# Patient Record
Sex: Female | Born: 1935
Health system: Southern US, Community
[De-identification: ages and names within clinical notes are randomized; demographics above are authoritative.]

## PROBLEM LIST (undated history)

## (undated) DIAGNOSIS — M1990 Unspecified osteoarthritis, unspecified site: Secondary | ICD-10-CM

## (undated) DIAGNOSIS — E785 Hyperlipidemia, unspecified: Secondary | ICD-10-CM

## (undated) DIAGNOSIS — I501 Left ventricular failure: Secondary | ICD-10-CM

## (undated) DIAGNOSIS — I10 Essential (primary) hypertension: Secondary | ICD-10-CM

## (undated) DIAGNOSIS — E079 Disorder of thyroid, unspecified: Secondary | ICD-10-CM

## (undated) DIAGNOSIS — H409 Unspecified glaucoma: Secondary | ICD-10-CM

## (undated) DIAGNOSIS — F32A Depression, unspecified: Secondary | ICD-10-CM

## (undated) DIAGNOSIS — M159 Polyosteoarthritis, unspecified: Secondary | ICD-10-CM

## (undated) DIAGNOSIS — F329 Major depressive disorder, single episode, unspecified: Secondary | ICD-10-CM

## (undated) DIAGNOSIS — N189 Chronic kidney disease, unspecified: Secondary | ICD-10-CM

## (undated) HISTORY — PX: FRACTURE SURGERY: SHX138

## (undated) HISTORY — DX: Hyperlipidemia, unspecified: E78.5

## (undated) HISTORY — DX: Depression, unspecified: F32.A

## (undated) HISTORY — PX: CORNEAL TRANSPLANT: SHX108

## (undated) HISTORY — DX: Major depressive disorder, single episode, unspecified: F32.9

## (undated) HISTORY — DX: Disorder of thyroid, unspecified: E07.9

## (undated) HISTORY — DX: Unspecified glaucoma: H40.9

## (undated) HISTORY — DX: Chronic kidney disease, unspecified: N18.9

## (undated) HISTORY — DX: Essential (primary) hypertension: I10

## (undated) HISTORY — DX: Unspecified osteoarthritis, unspecified site: M19.90

---

## 2004-12-02 ENCOUNTER — Ambulatory Visit: Payer: Self-pay | Admitting: Internal Medicine

## 2005-07-07 ENCOUNTER — Emergency Department: Payer: Self-pay | Admitting: Unknown Physician Specialty

## 2006-01-05 ENCOUNTER — Ambulatory Visit: Payer: Self-pay | Admitting: Internal Medicine

## 2007-01-11 ENCOUNTER — Ambulatory Visit: Payer: Self-pay | Admitting: Internal Medicine

## 2008-01-14 ENCOUNTER — Ambulatory Visit: Payer: Self-pay | Admitting: Internal Medicine

## 2008-03-20 ENCOUNTER — Ambulatory Visit: Payer: Self-pay | Admitting: Orthopaedic Surgery

## 2008-03-27 ENCOUNTER — Ambulatory Visit: Payer: Self-pay

## 2008-09-23 ENCOUNTER — Inpatient Hospital Stay: Payer: Self-pay | Admitting: Internal Medicine

## 2009-01-14 ENCOUNTER — Ambulatory Visit: Payer: Self-pay | Admitting: Internal Medicine

## 2009-09-23 ENCOUNTER — Inpatient Hospital Stay: Payer: Self-pay | Admitting: Internal Medicine

## 2010-03-18 ENCOUNTER — Ambulatory Visit: Payer: Self-pay | Admitting: Internal Medicine

## 2011-07-15 ENCOUNTER — Emergency Department: Payer: Self-pay | Admitting: Emergency Medicine

## 2014-06-12 ENCOUNTER — Emergency Department: Payer: Self-pay | Admitting: Student

## 2014-09-24 ENCOUNTER — Inpatient Hospital Stay: Payer: Self-pay | Admitting: Internal Medicine

## 2014-09-24 LAB — COMPREHENSIVE METABOLIC PANEL
ALT: 22 U/L
ANION GAP: 4 — AB (ref 7–16)
Albumin: 3.3 g/dL — ABNORMAL LOW (ref 3.4–5.0)
Alkaline Phosphatase: 57 U/L
BUN: 31 mg/dL — AB (ref 7–18)
Bilirubin,Total: 0.7 mg/dL (ref 0.2–1.0)
CHLORIDE: 105 mmol/L (ref 98–107)
CO2: 31 mmol/L (ref 21–32)
CREATININE: 1.91 mg/dL — AB (ref 0.60–1.30)
Calcium, Total: 9.8 mg/dL (ref 8.5–10.1)
EGFR (African American): 32 — ABNORMAL LOW
GFR CALC NON AF AMER: 26 — AB
Glucose: 101 mg/dL — ABNORMAL HIGH (ref 65–99)
Osmolality: 286 (ref 275–301)
POTASSIUM: 3.6 mmol/L (ref 3.5–5.1)
SGOT(AST): 29 U/L (ref 15–37)
Sodium: 140 mmol/L (ref 136–145)
TOTAL PROTEIN: 7.3 g/dL (ref 6.4–8.2)

## 2014-09-24 LAB — URINALYSIS, COMPLETE
BACTERIA: NONE SEEN
Bilirubin,UR: NEGATIVE
Glucose,UR: NEGATIVE mg/dL (ref 0–75)
KETONE: NEGATIVE
NITRITE: NEGATIVE
Ph: 5 (ref 4.5–8.0)
SPECIFIC GRAVITY: 1.02 (ref 1.003–1.030)
Squamous Epithelial: 4

## 2014-09-24 LAB — CK TOTAL AND CKMB (NOT AT ARMC)
CK, Total: 54 U/L (ref 26–192)
CK-MB: 2.7 ng/mL (ref 0.5–3.6)

## 2014-09-24 LAB — TROPONIN I
Troponin-I: 0.06 ng/mL — ABNORMAL HIGH
Troponin-I: 0.04 ng/mL

## 2014-09-24 LAB — CBC
HCT: 42.6 % (ref 35.0–47.0)
HGB: 13.8 g/dL (ref 12.0–16.0)
MCH: 30 pg (ref 26.0–34.0)
MCHC: 32.3 g/dL (ref 32.0–36.0)
MCV: 93 fL (ref 80–100)
Platelet: 209 10*3/uL (ref 150–440)
RBC: 4.58 10*6/uL (ref 3.80–5.20)
RDW: 14.4 % (ref 11.5–14.5)
WBC: 15.5 10*3/uL — AB (ref 3.6–11.0)

## 2014-09-24 LAB — HEMOGLOBIN A1C: HEMOGLOBIN A1C: 5.6 % (ref 4.2–6.3)

## 2014-09-25 LAB — BASIC METABOLIC PANEL WITH GFR
Anion Gap: 6 — ABNORMAL LOW (ref 7–16)
BUN: 24 mg/dL — ABNORMAL HIGH (ref 7–18)
Calcium, Total: 8.4 mg/dL — ABNORMAL LOW (ref 8.5–10.1)
Chloride: 110 mmol/L — ABNORMAL HIGH (ref 98–107)
Co2: 26 mmol/L (ref 21–32)
Creatinine: 1.46 mg/dL — ABNORMAL HIGH (ref 0.60–1.30)
EGFR (African American): 43 — ABNORMAL LOW
EGFR (Non-African Amer.): 35 — ABNORMAL LOW
Glucose: 99 mg/dL (ref 65–99)
Osmolality: 287 (ref 275–301)
Potassium: 3.6 mmol/L (ref 3.5–5.1)
Sodium: 142 mmol/L (ref 136–145)

## 2014-09-25 LAB — CBC WITH DIFFERENTIAL/PLATELET
Basophil #: 0 x10 3/mm 3 (ref 0.0–0.1)
Basophil %: 0.3 %
Eosinophil #: 0.3 x10 3/mm 3 (ref 0.0–0.7)
Eosinophil %: 3.1 %
HCT: 36.6 % (ref 35.0–47.0)
HGB: 12 g/dL (ref 12.0–16.0)
Lymphocyte %: 13.7 %
Lymphs Abs: 1.4 x10 3/mm 3 (ref 1.0–3.6)
MCH: 30.5 pg (ref 26.0–34.0)
MCHC: 32.6 g/dL (ref 32.0–36.0)
MCV: 93 fL (ref 80–100)
Monocyte #: 1 x10 3/mm — ABNORMAL HIGH (ref 0.2–0.9)
Monocyte %: 9.8 %
Neutrophil #: 7.6 x10 3/mm 3 — ABNORMAL HIGH (ref 1.4–6.5)
Neutrophil %: 73.1 %
Platelet: 181 x10 3/mm 3 (ref 150–440)
RBC: 3.92 X10 6/mm 3 (ref 3.80–5.20)
RDW: 14.5 % (ref 11.5–14.5)
WBC: 10.3 x10 3/mm 3 (ref 3.6–11.0)

## 2014-09-26 LAB — URINE CULTURE

## 2014-09-29 LAB — CULTURE, BLOOD (SINGLE)

## 2014-12-23 ENCOUNTER — Emergency Department: Payer: Self-pay | Admitting: Emergency Medicine

## 2014-12-24 ENCOUNTER — Non-Acute Institutional Stay: Payer: Medicare Other | Admitting: Internal Medicine

## 2014-12-24 ENCOUNTER — Encounter: Payer: Self-pay | Admitting: Internal Medicine

## 2014-12-24 VITALS — BP 132/72 | HR 89 | Temp 98.3°F | Resp 18 | Wt 132.0 lb

## 2014-12-24 DIAGNOSIS — W19XXXA Unspecified fall, initial encounter: Secondary | ICD-10-CM

## 2014-12-24 DIAGNOSIS — S0191XA Laceration without foreign body of unspecified part of head, initial encounter: Secondary | ICD-10-CM

## 2014-12-24 DIAGNOSIS — Y92129 Unspecified place in nursing home as the place of occurrence of the external cause: Principal | ICD-10-CM

## 2014-12-24 NOTE — Progress Notes (Addendum)
Subjective:    Patient ID: Amber Reese, female    DOB: 09/28/1918, 81 y.o.   MRN: [id number]  HPI  Asked to evaluate resident in [address] Had a fall yesterday. Was trying to walk around her room without her walker. Fell back against the door case, hit her head.  Went to ER, CT head normal, 5 staples to posterior head She denies increased confusion, headache, blurred vision or dizziness She has not needed to take anything for the pain, RN has been doing neuro checks This is not the first time she has fallen with head injury.  Review of Systems     Past Medical History  Diagnosis Date  . Hypertension   . Chronic kidney disease   . Glaucoma   . Osteoarthritis   . Hyperlipidemia   . Thyroid disease   . Depression    Outpatient Encounter Prescriptions as of 12/24/2014  Medication Sig  . aspirin EC 81 MG tablet Take 1 tablet (81 mg total) by mouth daily.  Marland Kitchen levothyroxine (SYNTHROID, LEVOTHROID) 50 MCG tablet Take 1 tablet (50 mcg total) by mouth daily.  . nortriptyline (PAMELOR) 50 MG capsule Take 1 capsule (50 mg total) by mouth 2 (two) times daily.  Marland Kitchen tolterodine (DETROL LA) 4 MG 24 hr capsule Take 1 capsule (4 mg total) by mouth daily.      History   Social History  . Marital Status: Widowed    Spouse Name: N/A  . Number of Children: N/A  . Years of Education: N/A   Occupational History  . Not on file.   Social History Main Topics  . Smoking status: Never Smoker   . Smokeless tobacco: Not on file  . Alcohol Use: Not on file  . Drug Use: Not on file  . Sexual Activity: Not on file   Other Topics Concern  . Not on file   Social History Narrative  . No narrative on file     Constitutional: Denies fever, malaise, fatigue, headache or abrupt weight changes.  Respiratory: Denies difficulty breathing, shortness of breath, cough or sputum production.   Cardiovascular: Denies chest pain, chest tightness, palpitations or swelling in the hands or feet.    Musculoskeletal: Pt reports difficulty with gait. Denies decrease in range of motion, muscle pain or joint pain and swelling.  Skin: Pt reports laceration to head. Denies redness, rashes, lesions or ulcercations.  Neurological: Pt reports difficulty with memory (prior to accident) and problems with balance. Denies dizziness, difficulty with memory, difficulty with speech.   No other specific complaints in a complete review of systems (except as listed in HPI above).  Objective:   Physical Exam   BP 132/72 mmHg  Pulse 89  Temp(Src) 98.3 F (36.8 C)  Resp 18  Wt 132 lb (59.875 kg) Wt Readings from Last 3 Encounters:  12/24/14 132 lb (59.875 kg)    General: Appears her stated age, chronically ill appearing in NAD. Skin: Warm, dry and intact. 2.5 inch laceration to posterior head. 5 staples intact. Wound not draining. No s/s of infection. HEENT: Head: normal shape and size; Eyes: sclera white, no icterus, conjunctiva pink, PERRLA and EOMs intact;  Cardiovascular: Normal rate and rhythm. S1,S2 noted.  No murmur, rubs or gallops noted.  Pulmonary/Chest: Normal effort and positive vesicular breath sounds. No respiratory distress. No wheezes, rales or ronchi noted.  Musculoskeletal: As I walked in to her apt, she was stumbling around the bedroom without her walker. Gait unsteady. Neurological: Alert and oriented.  Assessment & Plan:   ER follow up s/p fall with head injury:  OK to wash hair tomorrow with warm water Bacitracin to laceration BID Will continue neuro checks for 24-48 hours Will remove staples in 7-10 days Advised her to use her walker wherever she goes, only if a short distance Will refer to PT/OT for eval  Will follow up as needed

## 2014-12-24 NOTE — Patient Instructions (Signed)
Fall Prevention and Home Safety °Falls cause injuries and can affect all age groups. It is possible to prevent falls.  °HOW TO PREVENT FALLS °· Wear shoes with rubber soles that do not have an opening for your toes. °· Keep the inside and outside of your house well lit. °· Use night lights throughout your home. °· Remove clutter from floors. °· Clean up floor spills. °· Remove throw rugs or fasten them to the floor with carpet tape. °· Do not place electrical cords across pathways. °· Put grab bars by your tub, shower, and toilet. Do not use towel bars as grab bars. °· Put handrails on both sides of the stairway. Fix loose handrails. °· Do not climb on stools or stepladders, if possible. °· Do not wax your floors. °· Repair uneven or unsafe sidewalks, walkways, or stairs. °· Keep items you use a lot within reach. °· Be aware of pets. °· Keep emergency numbers next to the telephone. °· Put smoke detectors in your home and near bedrooms. °Ask your doctor what other things you can do to prevent falls. °Document Released: 07/30/2009 Document Revised: 04/03/2012 Document Reviewed: 01/03/2012 °[employer]® Patient Information ©2015 [employer]. This information is not intended to replace advice given to you by your health care provider. Make sure you discuss any questions you have with your health care provider. ° °

## 2014-12-26 ENCOUNTER — Encounter: Payer: Self-pay | Admitting: Internal Medicine

## 2014-12-26 MED ORDER — ASPIRIN EC 81 MG PO TBEC: 81.0000 mg | DELAYED_RELEASE_TABLET | Freq: Every day | ORAL | Status: AC

## 2014-12-26 MED ORDER — TOLTERODINE TARTRATE ER 4 MG PO CP24: 4.0000 mg | ORAL_CAPSULE | Freq: Every day | ORAL | Status: DC

## 2014-12-26 MED ORDER — NORTRIPTYLINE HCL 50 MG PO CAPS: 50.0000 mg | ORAL_CAPSULE | Freq: Two times a day (BID) | ORAL | Status: DC

## 2014-12-26 MED ORDER — LEVOTHYROXINE SODIUM 50 MCG PO TABS: 50.0000 ug | ORAL_TABLET | Freq: Every day | ORAL | Status: AC

## 2014-12-26 NOTE — Addendum Note (Signed)
Addended by: Jearld Fenton on: 12/26/2014 11:52 AM   Modules accepted: Orders

## 2015-02-07 NOTE — Discharge Summary (Signed)
PATIENT NAME:  Amber Reese, Amber Reese MR#:  [id number] DATE OF BIRTH:  [birth year]  DATE OF ADMISSION:  09/24/2014 DATE OF DISCHARGE:  09/26/2014  DISCHARGE DIAGNOSES: 1.  Acute on chronic renal failure. 2.  Urinary tract infection.  3.  Weakness from all of the above.   DISCHARGE MEDICATIONS: Per [facility] med reconciliation. Please see for details. Basically, she will be on her usual home meds with the addition of cefuroxime 500 b.i.d. for a week.   HISTORY AND PHYSICAL: Please see detailed history and physical done on admission.   HOSPITAL COURSE: The patient was admitted with the above. She had leukocytosis. Creatinine increased to 1.91, then decreased the following day to 1.46 with IV fluids and white blood cell count came down to 10,300. Her strength improved and she was able to ambulate 450 feet. Urine cultures were no growth at 8 and 12 hours. They have not been re-reported as of yet. However, as she responded to ceftriaxone, so we will give her cefuroxime at home as noted above as it should have comparable efficacy in the urinary tract infection. Renal cell was negative for obstruction, did show some medical renal disease consistent with her history of CKD III. She will be seen soon in the office. Notably her son is in town from [place] and will be with her for at least a few days.    TIME SPENT: It took approximately 35 minutes to do all discharge tasks today.  ____________________________ Ocie Cornfield. Ouida Sills, MD mwa:sb D: 09/26/2014 10:08:24 ET T: 09/26/2014 13:56:11 ET JOB#: [id number]  cc: Ocie Cornfield. Ouida Sills, MD, <Dictator> Kirk Ruths MD ELECTRONICALLY SIGNED 09/29/2014 13:07

## 2015-02-07 NOTE — Consult Note (Signed)
PATIENT NAME:  Amber Reese, Amber Reese MR#:  [id number] DATE OF BIRTH:  [birth year]  DATE OF CONSULTATION:  09/24/2014  REFERRING PHYSICIAN:  Theodoro Grist, MD CONSULTING PHYSICIAN:  Corey Skains, MD  REASON FOR CONSULTATION: Dizziness, chronic kidney disease, and elevated troponin.   CHIEF COMPLAINT: "I am weak."  HISTORY OF PRESENT ILLNESS: The patient is a 81 year old female with known chronic kidney disease stage IV, who has had appropriate treatment for this and is doing reasonably well. She has had weakness, fatigue and shortness of breath of unknown etiology, and was seen in the Emergency Room with concerns for infection. The patient has been placed on antibiotics for possible unknown infection and is currently feeling better after eating at dinner. The patient does have an elevated troponin of 0.06, more consistent with renal disease and no evidence of current myocardial infarction. She does have an EKG showing normal sinus rhythm with septal myocardial infarction, but no evidence of tachycardia. She has not had any evidence of congestive heart failure or true angina. The remainder of the review of systems was negative for vision change, ringing in the ears, hearing loss, cough, congestion, heartburn, nausea, vomiting, diarrhea, bloody stools, stomach pain, extremity pain, leg weakness, cramping of the buttocks, known blood clots, headaches, blackouts, nosebleeds, frequent urination, urination at night, muscle weakness, numbness, anxiety, depression, skin lesions or skin rashes.   PAST MEDICAL HISTORY:  1.  Chronic kidney disease.  2.  Mixed hyperlipidemia.  3.  Hypertension.   FAMILY HISTORY: No family members with early onset of cardiovascular disease or hypertension.   SOCIAL HISTORY: Currently denies alcohol or tobacco use.   ALLERGIES: AS LISTED.   MEDICATIONS: As listed.   PHYSICAL EXAMINATION:  VITAL SIGNS: Blood pressure is 122/68 bilaterally. Heart rate 72 upright, reclining, and  regular.  GENERAL: She is a well-appearing female in no acute distress.  HEENT: No icterus, thyromegaly, ulcers, hemorrhage, or xanthelasma.  CARDIOVASCULAR: Regular rate and rhythm. Normal S1, S2 without murmur, gallop, or rub. PMI is inferiorly displaced. Carotid upstroke normal without bruit. Jugular pressure is normal.  LUNGS: Have a few basilar crackles, with normal respirations.  ABDOMEN: Soft, nontender, without hepatosplenomegaly or masses. Abdominal aorta is normal size without bruit.  EXTREMITIES: There are 2+ radial, femoral, and dorsal pedal pulses, with trace lower extremity edema. No cyanosis, clubbing or ulcers.  NEUROLOGIC: She is oriented to time, place, and person, with normal mood and affect.   ASSESSMENT: A 81 year old female with dizziness, chronic kidney disease stage IV, elevated troponin more consistent with renal disease, and no evidence of myocardial infarction, with abnormal EKG, improving and stable at this time, without evidence of myocardial infarction, congestive heart failure or true angina.   RECOMMENDATIONS:  1.  No further cardiac work-up of minimal troponin elevation.  2.  Consider echocardiogram if LV systolic dysfunction and/or other symptoms occur with ambulation.  3.  Continue chronic kidney disease stage IV treatment and use hydration if able.  4.  Antibiotic treatment for possible concerns of infection, which may be exacerbating of symptoms above.    ____________________________ Corey Skains, MD bjk:MT D: 09/24/2014 19:32:06 ET T: 09/24/2014 19:45:54 ET JOB#: [id number]  cc: Corey Skains, MD, <Dictator> Corey Skains MD ELECTRONICALLY SIGNED 09/26/2014 7:55

## 2015-02-11 NOTE — H&P (Signed)
PATIENT NAME:  Amber Reese, Amber Reese MR#:  [id number] DATE OF BIRTH:  [birth year]  DATE OF ADMISSION:  09/24/2014  PRIMARY CARE PHYSICIAN: Dr. Ouida Sills.  HISTORY OF PRESENT ILLNESS: The patient is a 81 year old, Caucasian female, with past medical history significant for history of hyperlipidemia, hypothyroidism, history of renal insufficiency, TIA in the past, who presents to the hospital with complaints of lightheadedness and dizziness. Apparently, the patient has not been doing well over the past few days. She has been weak and getting lightheaded and dizzy, especially whenever she was stands up and walks. On Monday, she fell down, had somewhat poor coordination and fell down on her buttocks. While she was sitting, however,  her head again went back and she fell down again and hit her head. Still over the past 2 days, she is having significantly poor balance. She stayed in a few days ago, and, today her housekeeper saw her and felt that she is just not doing well, and notified emergency personnel, and the patient was brought to the Emergency Room for further evaluation. In the Emergency Room, she was hypotensive and she was noted to have acute on chronic renal failure with creatinine of 1.9. Her troponin was also found to be elevated to 0.06. The patient denies any chest pain, however.  PAST MEDICAL HISTORY:  Significant for history of admission in 2010 for diarrhea, hypotension, acute renal failure which was felt to be due to Diamox, hyperlipidemia, hypothyroidism, renal insufficiency chronic, questionable coronary artery disease, TIAs, as well as elevated blood presssure, not on any medications in the past. Past medical history also significant for history of glaucoma, urinary incontinence, osteoporosis, psoriasis and anxiety.   MEDICATIONS: According to medical records, the patient is on aspirin 81 mg p.o. daily, Detrol LA 4 mg p.o. daily, Durezol 1 drop twice daily, levothyroxine 50 mcg once weekly and 50 mcg  once a day, Muro 128 1 drop twice daily, nortriptyline 50 mg p.o. daily, Pravachol 40 mg p.o. daily, Timolol ophthalmic solution 0.5% twice daily.  PAST SURGICAL HISTORY:  Right eye glaucoma operation, which was unsuccessful, which left the patient legally blind, appendectomy as well as tonsillectomy, bilateral cataract removal.  ALLERGIES: None.   FAMILY HISTORY: The patient's sister had depression. The patient's other sister had breast carcinoma. Her niece also had breast carcinoma.   SOCIAL HISTORY: The patient's second husband died in 49. She has been living in an independent living facility ever since. Now she wants to try to get an assisted living facility. Denies any smoking according to prior. Admits of having drinking alcohol intermittently socially only. She used to be a Pharmacist, hospital.   REVIEW OF SYSTEMS: Dizzy, lightheaded, feeling fatigue and weak for the past 2 or 3 days, admitting of some blurring of vision. Admits of having poor vision in her eyes bilaterally. The right eye is legally blind, left eye has some dystrophy with some problems with her left eye, which she has been discussing with [facility] about a possible operation. Admits of having intermittent arrhythmias and feeling presyncopal, especially whenever she stands up. Admits of intermittent constipation, as well as increased frequency of urination, especially at nighttime. She needs to get up at nighttime 3 times to urinate. She drinks plenty of fluids. Admits of using a cane all the time. Denies any fevers or chills, denies any pains, weight loss or gain.  EYES: Denies any double vision , admits of glaucoma. ENT: Denies nasal discharge, bleeding, sinus congestion.  RESPIRATORY: No cough, wheezing, shortness of breath. CARDIOVASCULAR:  Denies chest pains, orthopnea, edema, palpitations. GASTROINTESTINAL: Denies nausea, vomiting, or diarrhea,  change in bowel habits.  GENITOURINARY: Denies dysuria, hematuria or  incontinence. ENDOCRINOLOGY: Denies swollen glands, thirs or excessive urination. HEMATOLOGIC: Denies anemia, easy bruising or bleeding, or swollen glands.  SKIN: Denies acne, rash, lesions, or change in moles.  MUSCULOSKELETAL: Denies arthritis, cramps, swelling. NEUROLOGIC: Denies numbness, lateralized weakness.  PSYCHIATRIC: Denies anxiety, insomnia or depression.  PHYSICAL EXAMINATION:  VITAL SIGNS: On arrival to the hospital, the patient's temperature was not measured, pulse was 83, respiration was 18, blood pressure 91/76, saturation was 97% on room air.  GENERAL: This is a well-developed, well-nourished, pale Caucasian female in no significant distress, lying on the bed.  HEENT: Her pupils, the left is reactive to light. The right seemed to be fixed. Extraocular movements intact. No pharynx erythema. Mucosa is dry.  NECK: No masses. Supple, nontender, no thyromegaly, no adenopathy. No JVD or carotid bruits bilaterally. Full range of motion.  LUNGS: Clear to auscultation in all fields. A few rales were heard on the left posteriorly lower aspect of the lungs. Diminished breath sounds. No wheezing, dullness to  percussion, or overt respiratory distress.  CARDIOVASCULAR: Rythm was regular, no murmurs, gallops or rubs. No lower extremity edema, calf tenderness or cyanosis was noted.  ABDOMEN: Soft, nontender. Bowel sounds are present. No hepatosplenomegaly or masses were noted.  RECTAL: Deferred.  MUSCLE STRENGTH: Able to move all extremities. No cyanosis, degenerative joint disease. Mild kyphosis was noted. Gait was not tested.  SKIN: Did not reveal any rashes, lesions, erythema, nodularity, or induration. It was warm and dry to palpation.  LYMPHATIC: No adenopathy in the cervical region.  NEUROLOGIC: Cranial nerves grossly intact. Sensory is intact. No icterus or aphasia. The patient is alert, oriented to time, person, place, cooperative. Memory is good.  PSYCHIATRIC: No significant  confusion, agitation, or depression was noted.   LABORATORIES: BMP showed glucose level of 101, BUN and creatinine were 31 and 1.91, otherwise unremarkable. Liver enzymes, albumin level of 3.3, otherwise unremarkable. Troponin is elevated at 0.06, white blood cell count is elevated to 15.5, hemoglobin 13.8, platelet count was 209,000.   Urinalysis: Yellow hazy urine, negative for glucose, bilirubin or ketones. Specific gravity 1.020, pH was 5.0, 1+ blood, 30 mg/dL of protein, negative for nitrites, 3+ leukocyte esterase, 21 red blood cells, 77 white blood cells, no bacteria was seen, mucous was present,  27 hyaline casts and calcium oxalate crystals.   EKG: Normal sinus rhythm at 85 beats per minute, normal axis, low voltage QRS, septal infarct, age indeterminate, and no acute ST-T changes were noted and no EKG to compare with.   RADIOLOGIC STUDIES: CT scan of head without contrast, 09/24/2014, showed a stable head CT, no acute intracranial or calvarial findings.   ASSESSMENT AND PLAN: 1. Dizziness , suspicious for orthostatic hypotesnion,  admit the patient to the medical floor. Continue IV fluids. Get orthostatic vital signs checked every shift.  2. Acute on chronic renal failure. We will continue IV fluids. Get ultrasound of the kidneys. We will get a urine culture to rule out urinary tract infection.  3. Hypotesnion, likley SIRS. We will continue IV fluids. The patient is not on any blood pressure medications, questionable Detrol versus infection related. We will follow. We will continue IV fluids for now. We will hold some of her home medications, which could decrease her blood pressure and cause orthostatic hypotesnion in general. Will get blood cultures as well. 4. Elevated troponin, possibly hypotension  related. We will continue the patient on aspirin as well as heparin subcutaneously. No nitroglycerin or beta blockers due to hypotension. We will get an echocardiogram, as well as cardiology  consultation. 5. Hyperglycemia, get hemoglobin A1c to rule out diabetes mellitus as cause of dehydration.  6. Likely urinary tract infection. Get the urine cultures. We'll initiate Rocephin while awaiting for urine cultures.   TIME SPENT: 50 minutes.    ____________________________ Theodoro Grist, MD rv:JT D: 09/24/2014 14:30:01 ET T: 09/24/2014 15:26:32 ET JOB#: [id number]  cc: Theodoro Grist, MD, <Dictator> Ocie Cornfield. Ouida Sills, MD Theodoro Grist MD ELECTRONICALLY SIGNED 10/28/2014 18:39

## 2015-04-07 ENCOUNTER — Emergency Department: Payer: Medicare Other

## 2015-04-07 ENCOUNTER — Encounter: Payer: Self-pay | Admitting: Emergency Medicine

## 2015-04-07 ENCOUNTER — Emergency Department
Admission: EM | Admit: 2015-04-07 | Discharge: 2015-04-07 | Disposition: A | Discharge status: Home / Self Care | Payer: Medicare Other | Attending: Emergency Medicine | Admitting: Emergency Medicine

## 2015-04-07 DIAGNOSIS — I129 Hypertensive chronic kidney disease with stage 1 through stage 4 chronic kidney disease, or unspecified chronic kidney disease: Secondary | ICD-10-CM | POA: Insufficient documentation

## 2015-04-07 DIAGNOSIS — Z79899 Other long term (current) drug therapy: Secondary | ICD-10-CM | POA: Diagnosis not present

## 2015-04-07 DIAGNOSIS — Y9389 Activity, other specified: Secondary | ICD-10-CM | POA: Diagnosis not present

## 2015-04-07 DIAGNOSIS — Z7982 Long term (current) use of aspirin: Secondary | ICD-10-CM | POA: Diagnosis not present

## 2015-04-07 DIAGNOSIS — W010XXA Fall on same level from slipping, tripping and stumbling without subsequent striking against object, initial encounter: Secondary | ICD-10-CM | POA: Diagnosis not present

## 2015-04-07 DIAGNOSIS — Y92121 Bathroom in nursing home as the place of occurrence of the external cause: Secondary | ICD-10-CM | POA: Diagnosis not present

## 2015-04-07 DIAGNOSIS — S3992XA Unspecified injury of lower back, initial encounter: Secondary | ICD-10-CM | POA: Diagnosis not present

## 2015-04-07 DIAGNOSIS — Z880 Allergy status to penicillin: Secondary | ICD-10-CM | POA: Insufficient documentation

## 2015-04-07 DIAGNOSIS — T148XXA Other injury of unspecified body region, initial encounter: Secondary | ICD-10-CM

## 2015-04-07 DIAGNOSIS — Y998 Other external cause status: Secondary | ICD-10-CM | POA: Insufficient documentation

## 2015-04-07 DIAGNOSIS — N189 Chronic kidney disease, unspecified: Secondary | ICD-10-CM | POA: Diagnosis not present

## 2015-04-07 DIAGNOSIS — T148 Other injury of unspecified body region: Secondary | ICD-10-CM | POA: Insufficient documentation

## 2015-04-07 DIAGNOSIS — W19XXXA Unspecified fall, initial encounter: Secondary | ICD-10-CM

## 2015-04-07 DIAGNOSIS — S4991XA Unspecified injury of right shoulder and upper arm, initial encounter: Secondary | ICD-10-CM | POA: Insufficient documentation

## 2015-04-07 NOTE — ED Provider Notes (Signed)
[facility] Emergency Department Provider Note   ____________________________________________  Time seen: On EMS arrival  I have reviewed the triage vital signs and the nursing notes.   HISTORY  Chief Complaint Fall   History limited by: Not Limited   HPI Amber Reese is a 81 y.o. female who presents to the emergency department today after a fall. Patient states that she lost balance her bathroom and started to fall down. She did grab the shower curtain with a right-handed and attempt to slow her fall however was not successful. States her right shoulder hit the wall and then she fell onto her blood tox. She was able to get up on her own and walk after this. She now is complaining primarily of pain around her tailbone as well as some right shoulder pain. She denies hitting her head, any headache or loss of consciousness. Denies any neck pain.     Past Medical History  Diagnosis Date  . Hypertension   . Chronic kidney disease   . Glaucoma   . Osteoarthritis   . Hyperlipidemia   . Thyroid disease   . Depression     There are no active problems to display for this patient.   No past surgical history on file.  Current Outpatient Rx  Name  Route  Sig  Dispense  Refill  . aspirin EC 81 MG tablet   Oral   Take 1 tablet (81 mg total) by mouth daily.   30 tablet   0   . levothyroxine (SYNTHROID, LEVOTHROID) 50 MCG tablet   Oral   Take 1 tablet (50 mcg total) by mouth daily.   90 tablet   3   . nortriptyline (PAMELOR) 50 MG capsule   Oral   Take 1 capsule (50 mg total) by mouth 2 (two) times daily.   60 capsule   0   . tolterodine (DETROL LA) 4 MG 24 hr capsule   Oral   Take 1 capsule (4 mg total) by mouth daily.   30 capsule   0     Allergies Penicillins  No family history on file.  Social History History  Substance Use Topics  . Smoking status: Never Smoker   . Smokeless tobacco: Not on file  . Alcohol Use: Not on file     Review of Systems  Constitutional: Negative for fever. Cardiovascular: Negative for chest pain. Respiratory: Negative for shortness of breath. Gastrointestinal: Negative for abdominal pain, vomiting and diarrhea. Genitourinary: Negative for dysuria. Musculoskeletal: Positive for right shoulder pain, tailbone pain Skin: Negative for rash. Neurological: Negative for headaches, focal weakness or numbness.   10-point ROS otherwise negative.  ____________________________________________   PHYSICAL EXAM:  VITAL SIGNS:  98.4 F (36.9 C)  91  18   160/75 mmHg  95 %     Constitutional: Alert and oriented. Well appearing and in no distress. Eyes: Conjunctivae are normal. PERRL. Normal extraocular movements. ENT   Head: Normocephalic and atraumatic.   Nose: No congestion/rhinnorhea.   Mouth/Throat: Mucous membranes are moist.   Neck: No stridor. Hematological/Lymphatic/Immunilogical: No cervical lymphadenopathy. Cardiovascular: Normal rate, regular rhythm.  No murmurs, rubs, or gallops. Respiratory: Normal respiratory effort without tachypnea nor retractions. Breath sounds are clear and equal bilaterally. No wheezes/rales/rhonchi. Gastrointestinal: Soft and nontender. No distention.  Genitourinary: Deferred Musculoskeletal: Tailbone tenderness. Mild right shoulder tenderness, full ROM, N/V intact. Neurologic:  Normal speech and language. No gross focal neurologic deficits are appreciated. Speech is normal.  Skin:  Skin is warm,  dry and intact. Ecchymosis. Psychiatric: Mood and affect are normal. Speech and behavior are normal. Patient exhibits appropriate insight and judgment.  ____________________________________________    LABS (pertinent positives/negatives)  None  ____________________________________________   EKG  None  ____________________________________________    RADIOLOGY  pelvis IMPRESSION: No evidence of fracture or  dislocation.  Right shoulder  IMPRESSION: No evidence of fracture or dislocation. ____________________________________________   PROCEDURES  Procedure(s) performed: None  Critical Care performed: No  ____________________________________________   INITIAL IMPRESSION / ASSESSMENT AND PLAN / ED COURSE  Pertinent labs & imaging results that were available during my care of the patient were reviewed by me and considered in my medical decision making (see chart for details).  Patient presents to the emergency department after a fall. Complaining of tailbone pain and right shoulder pain. X-rays were negative. Patient does have some ecchymosis over her tailbone. Think likely patient suffered contusion.  ____________________________________________   FINAL CLINICAL IMPRESSION(S) / ED DIAGNOSES  Final diagnoses:  Fall, initial encounter  Contusion     Nance Pear, MD 04/07/15 2332

## 2015-04-07 NOTE — ED Notes (Signed)
Pt c/o of tenderness to buttocks and right shoulder.  States buttocks is 8/10 and right shoulder 7/10.  Pain is a dull ache and becomes sharp with movement.  Right shoulder mildly TTP.  No obvious deformity noted to shoulder or buttocks region.  3x4in bruising noted to mid upper buttocks.

## 2015-04-07 NOTE — Discharge Instructions (Signed)
Please seek medical attention for any high fevers, chest pain, shortness of breath, change in behavior, persistent vomiting, bloody stool or any other new or concerning symptoms. ° ° °Fall Prevention and Home Safety °Falls cause injuries and can affect all age groups. It is possible to use preventive measures to significantly decrease the likelihood of falls. There are many simple measures which can make your home safer and prevent falls. °OUTDOORS °· Repair cracks and edges of walkways and driveways. °· Remove high doorway thresholds. °· Trim shrubbery on the main path into your home. °· Have good outside lighting. °· Clear walkways of tools, rocks, debris, and clutter. °· Check that handrails are not broken and are securely fastened. Both sides of steps should have handrails. °· Have leaves, snow, and ice cleared regularly. °· Use sand or salt on walkways during winter months. °· In the garage, clean up grease or oil spills. °BATHROOM °· Install night lights. °· Install grab bars by the toilet and in the tub and shower. °· Use non-skid mats or decals in the tub or shower. °· Place a plastic non-slip stool in the shower to sit on, if needed. °· Keep floors dry and clean up all water on the floor immediately. °· Remove soap buildup in the tub or shower on a regular basis. °· Secure bath mats with non-slip, double-sided rug tape. °· Remove throw rugs and tripping hazards from the floors. °BEDROOMS °· Install night lights. °· Make sure a bedside light is easy to reach. °· Do not use oversized bedding. °· Keep a telephone by your bedside. °· Have a firm chair with side arms to use for getting dressed. °· Remove throw rugs and tripping hazards from the floor. °KITCHEN °· Keep handles on pots and pans turned toward the center of the stove. Use back burners when possible. °· Clean up spills quickly and allow time for drying. °· Avoid walking on wet floors. °· Avoid hot utensils and knives. °· Position shelves so they are  not too high or low. °· Place commonly used objects within easy reach. °· If necessary, use a sturdy step stool with a grab bar when reaching. °· Keep electrical cables out of the way. °· Do not use floor polish or wax that makes floors slippery. If you must use wax, use non-skid floor wax. °· Remove throw rugs and tripping hazards from the floor. °STAIRWAYS °· Never leave objects on stairs. °· Place handrails on both sides of stairways and use them. Fix any loose handrails. Make sure handrails on both sides of the stairways are as long as the stairs. °· Check carpeting to make sure it is firmly attached along stairs. Make repairs to worn or loose carpet promptly. °· Avoid placing throw rugs at the top or bottom of stairways, or properly secure the rug with carpet tape to prevent slippage. Get rid of throw rugs, if possible. °· Have an electrician put in a light switch at the top and bottom of the stairs. °OTHER FALL PREVENTION TIPS °· Wear low-heel or rubber-soled shoes that are supportive and fit well. Wear closed toe shoes. °· When using a stepladder, make sure it is fully opened and both spreaders are firmly locked. Do not climb a closed stepladder. °· Add color or contrast paint or tape to grab bars and handrails in your home. Place contrasting color strips on first and last steps. °· Learn and use mobility aids as needed. Install an electrical emergency response system. °· Turn on lights   to avoid dark areas. Replace light bulbs that burn out immediately. Get light switches that glow. °· Arrange furniture to create clear pathways. Keep furniture in the same place. °· Firmly attach carpet with non-skid or double-sided tape. °· Eliminate uneven floor surfaces. °· Select a carpet pattern that does not visually hide the edge of steps. °· Be aware of all pets. °OTHER HOME SAFETY TIPS °· Set the water temperature for 120° F (48.8° C). °· Keep emergency numbers on or near the telephone. °· Keep smoke detectors on  every level of the home and near sleeping areas. °Document Released: 09/23/2002 Document Revised: 04/03/2012 Document Reviewed: 12/23/2011 °Exit[employer]® Patient Information ©2015 [employer]. This information is not intended to replace advice given to you by your health care provider. Make sure you discuss any questions you have with your health care provider. ° °

## 2015-04-07 NOTE — ED Notes (Signed)
Attempted to call [facility] with no answer.

## 2015-04-07 NOTE — ED Notes (Signed)
Patient transported to X-ray 

## 2015-04-07 NOTE — ED Notes (Signed)
Pt to ED from [facility] via EMS c/o fall.  Per EMS pt fell today in bathroom around 1700, reached out and grabbed shower curtain, hit right shoulder on wall and fell back onto buttocks.  EMS states pt denies dizziness, just lost balance.  Pt A&Ox4, speaking in complete and coherent sentences, and in NAD at this time.

## 2015-12-05 IMAGING — CR DG SHOULDER 2+V*R*
3 series · 3 of 3 positions shown · non-contrast
Comparison: None.

CLINICAL DATA: Status post fall, with injury to right shoulder on
wall. Initial encounter.

EXAM:
RIGHT SHOULDER - 2+ VIEW

[shoulder grashey]
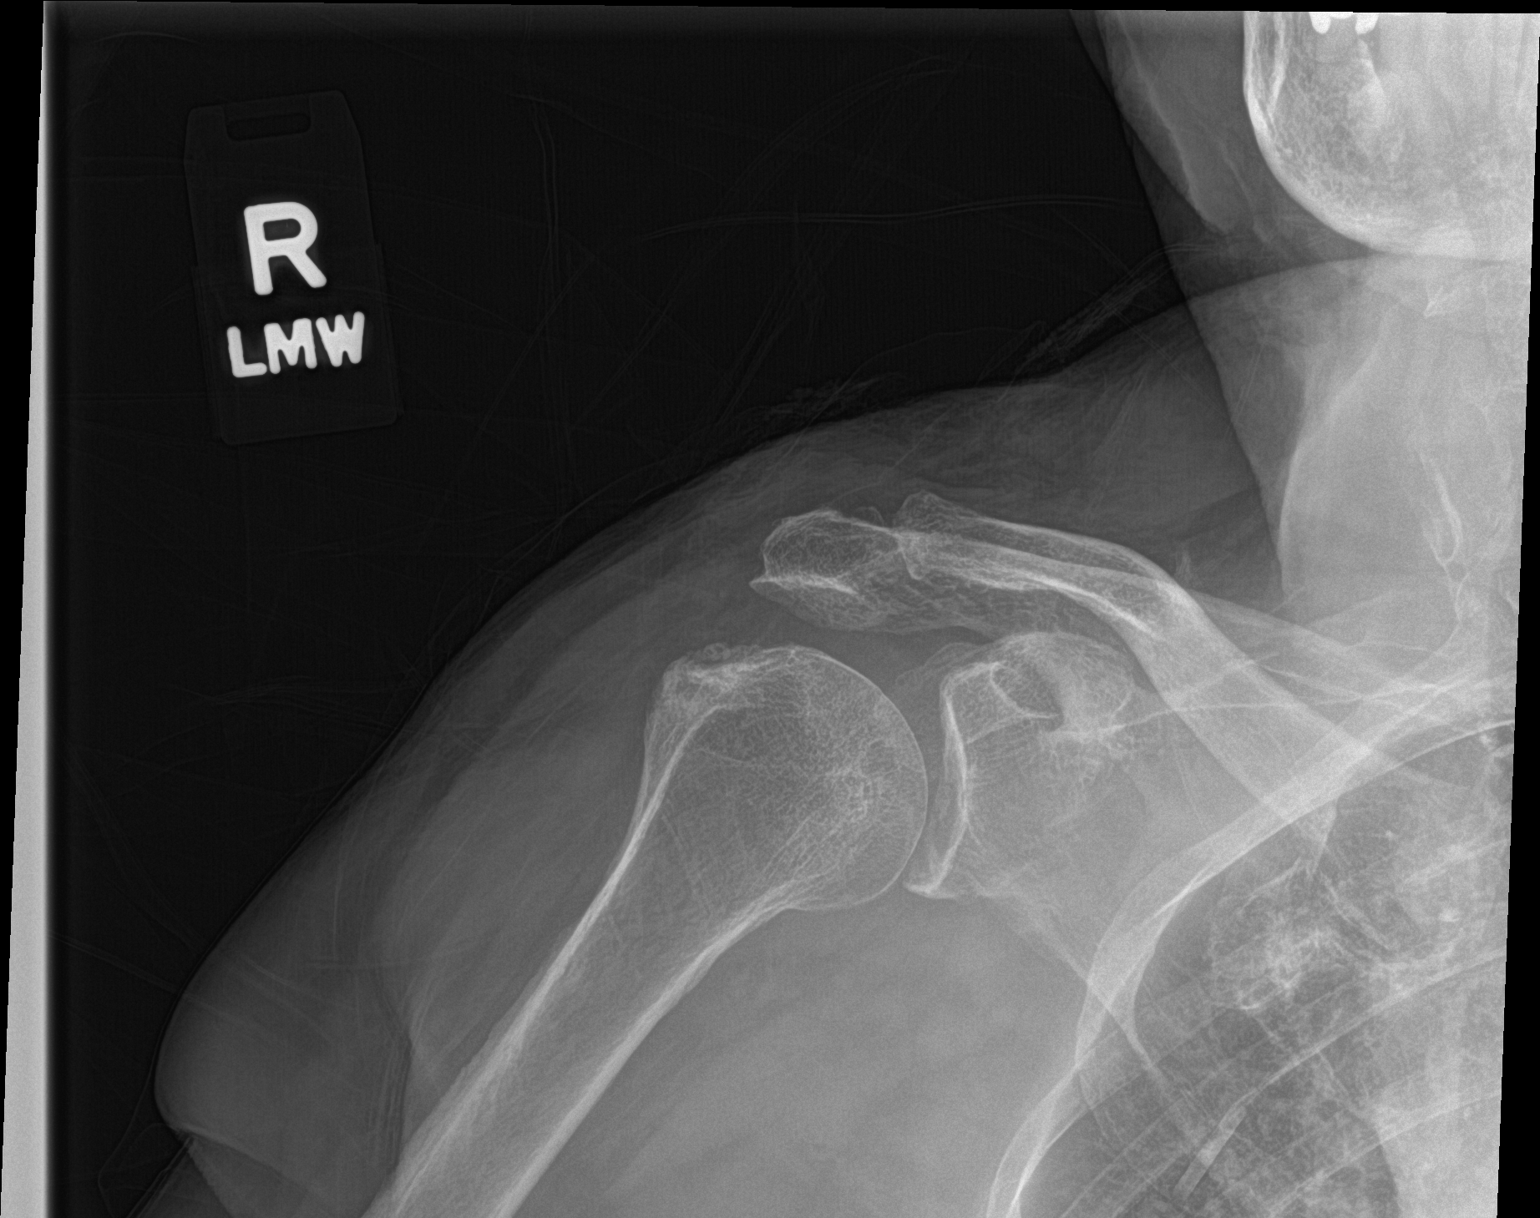

[shoulder y view]
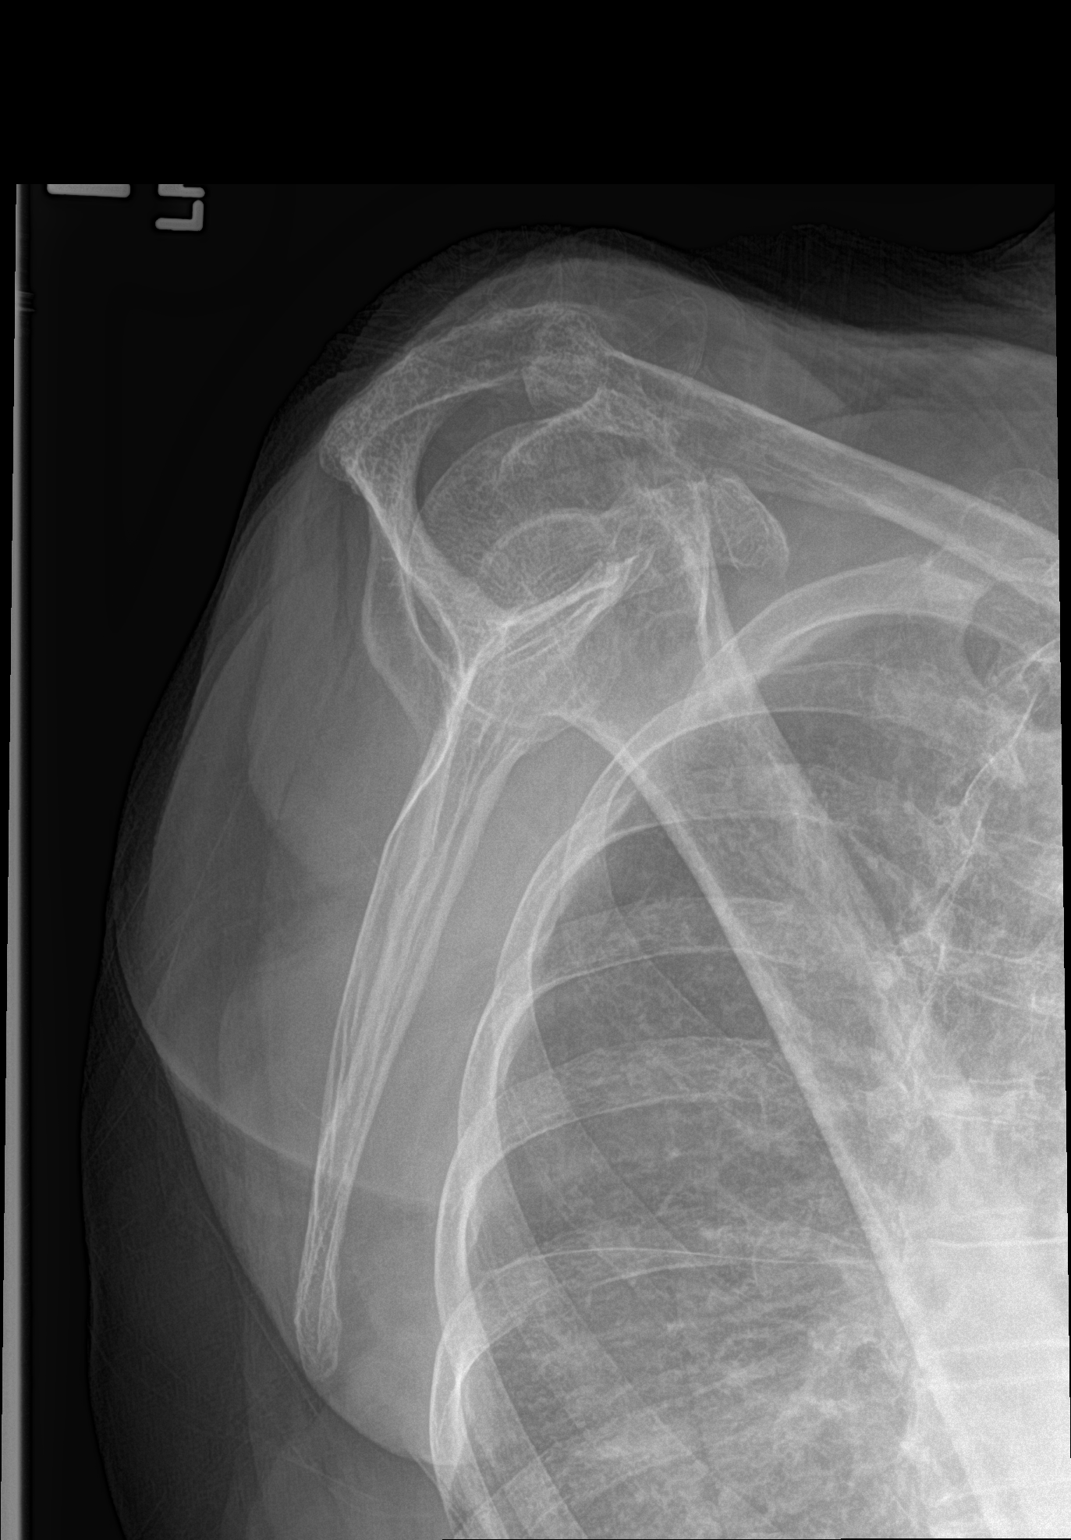

[shoulder axillary]
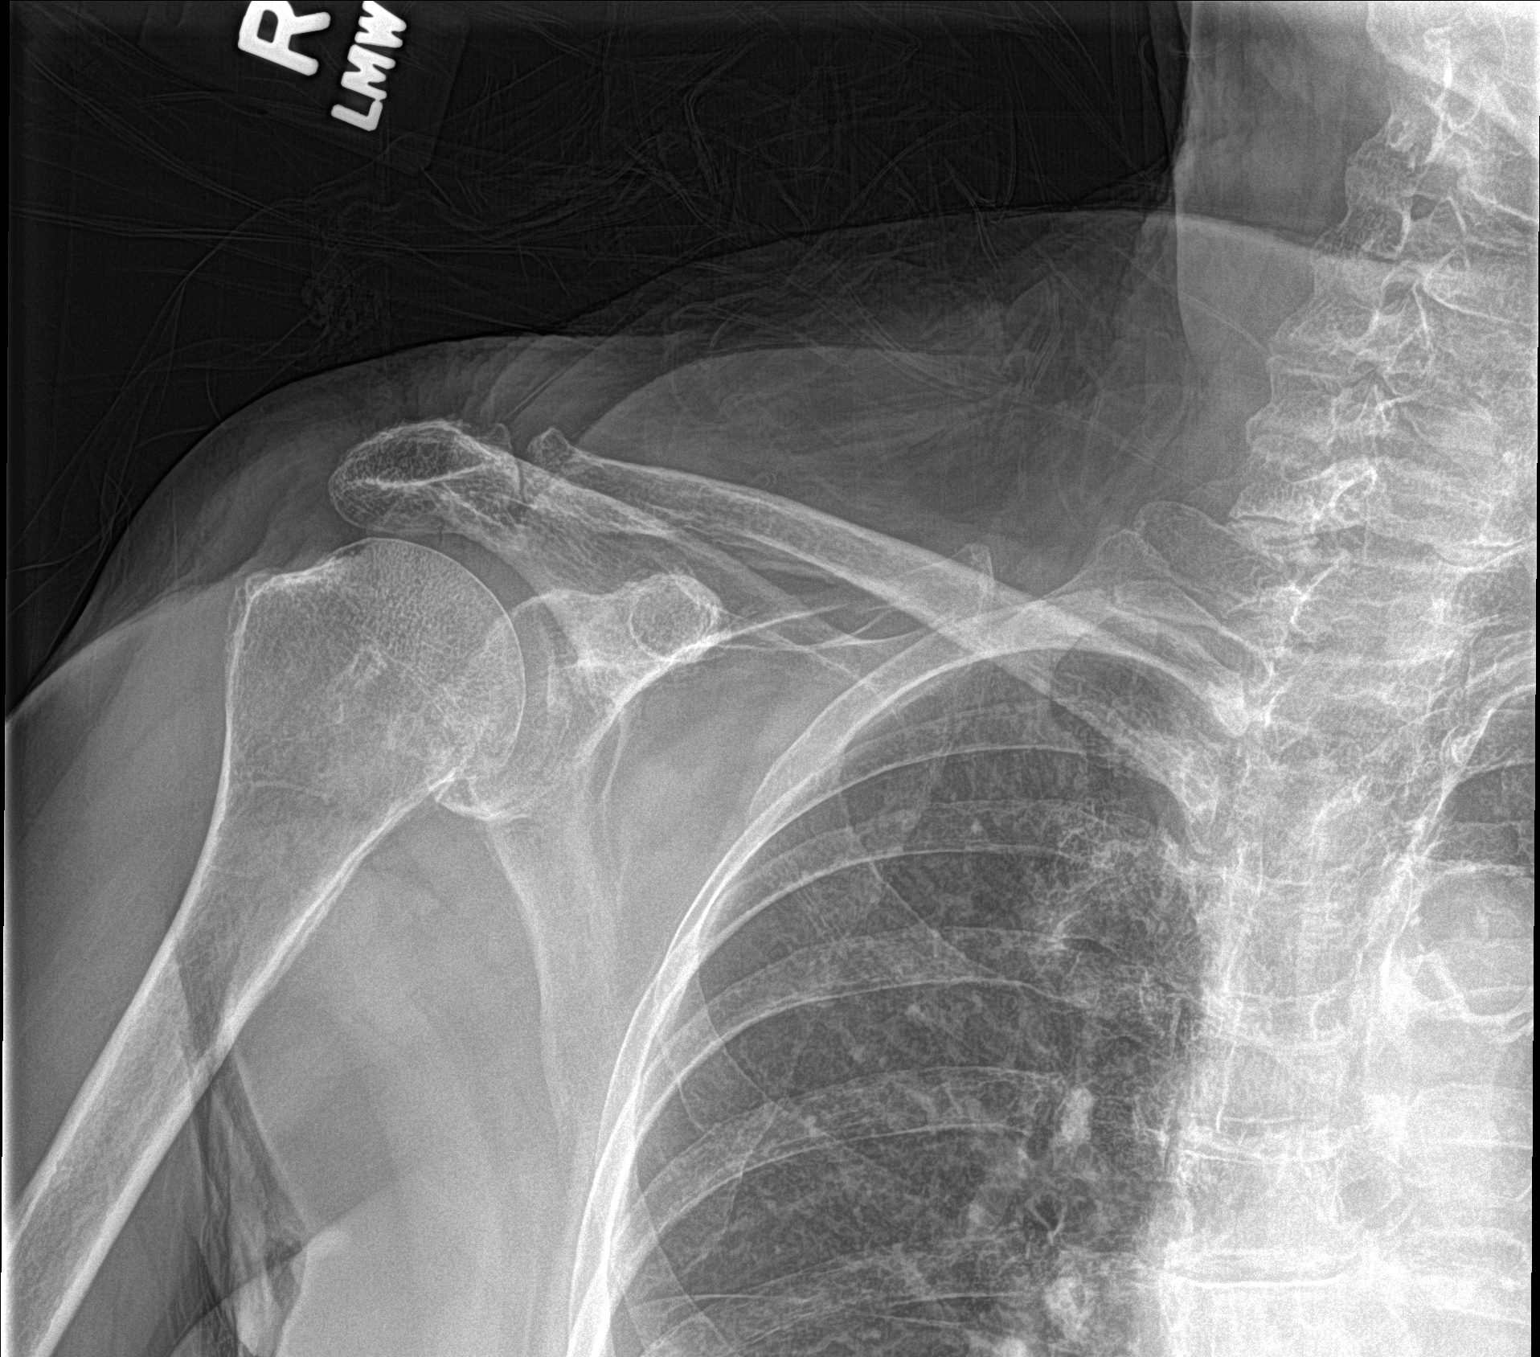

[3 of 3 positions shown; findings below may reference images not displayed]

FINDINGS: There is no evidence of fracture or dislocation. Mild degenerative
change is noted at the distal insertion of the rotator cuff. The
right humeral head is seated within the glenoid fossa. Mild
degenerative change is noted at the right acromioclavicular joint.
No significant soft tissue abnormalities are seen. The visualized
portions of the right lung are clear.
IMPRESSION: No evidence of fracture or dislocation.

## 2016-01-12 ENCOUNTER — Encounter: Payer: Self-pay | Admitting: Emergency Medicine

## 2016-01-12 ENCOUNTER — Emergency Department
Admission: EM | Admit: 2016-01-12 | Discharge: 2016-01-12 | Disposition: A | Discharge status: Skilled Nursing Facility | Payer: Medicare Other | Attending: Emergency Medicine | Admitting: Emergency Medicine

## 2016-01-12 ENCOUNTER — Emergency Department: Payer: Medicare Other

## 2016-01-12 DIAGNOSIS — I129 Hypertensive chronic kidney disease with stage 1 through stage 4 chronic kidney disease, or unspecified chronic kidney disease: Secondary | ICD-10-CM | POA: Diagnosis not present

## 2016-01-12 DIAGNOSIS — N189 Chronic kidney disease, unspecified: Secondary | ICD-10-CM | POA: Diagnosis not present

## 2016-01-12 DIAGNOSIS — F329 Major depressive disorder, single episode, unspecified: Secondary | ICD-10-CM | POA: Diagnosis not present

## 2016-01-12 DIAGNOSIS — Z79899 Other long term (current) drug therapy: Secondary | ICD-10-CM | POA: Insufficient documentation

## 2016-01-12 DIAGNOSIS — E785 Hyperlipidemia, unspecified: Secondary | ICD-10-CM | POA: Diagnosis not present

## 2016-01-12 DIAGNOSIS — Z880 Allergy status to penicillin: Secondary | ICD-10-CM | POA: Insufficient documentation

## 2016-01-12 DIAGNOSIS — N390 Urinary tract infection, site not specified: Secondary | ICD-10-CM

## 2016-01-12 DIAGNOSIS — Z7982 Long term (current) use of aspirin: Secondary | ICD-10-CM | POA: Insufficient documentation

## 2016-01-12 DIAGNOSIS — M1990 Unspecified osteoarthritis, unspecified site: Secondary | ICD-10-CM | POA: Diagnosis not present

## 2016-01-12 DIAGNOSIS — Z947 Corneal transplant status: Secondary | ICD-10-CM | POA: Insufficient documentation

## 2016-01-12 DIAGNOSIS — R531 Weakness: Secondary | ICD-10-CM | POA: Diagnosis not present

## 2016-01-12 DIAGNOSIS — R05 Cough: Secondary | ICD-10-CM | POA: Diagnosis present

## 2016-01-12 LAB — BASIC METABOLIC PANEL
Anion gap: 9 (ref 5–15)
BUN: 23 mg/dL — AB (ref 6–20)
CO2: 26 mmol/L (ref 22–32)
CREATININE: 1.33 mg/dL — AB (ref 0.44–1.00)
Calcium: 9.4 mg/dL (ref 8.9–10.3)
Chloride: 104 mmol/L (ref 101–111)
GFR calc Af Amer: 38 mL/min — ABNORMAL LOW (ref >60)
GFR calc non Af Amer: 33 mL/min — ABNORMAL LOW (ref >60)
GLUCOSE: 110 mg/dL — AB (ref 65–99)
Potassium: 4.6 mmol/L (ref 3.5–5.1)
SODIUM: 139 mmol/L (ref 135–145)

## 2016-01-12 LAB — CBC WITH DIFFERENTIAL/PLATELET
Basophils Absolute: 0 10*3/uL (ref 0–0.1)
Basophils Relative: 0 %
Eosinophils Absolute: 0.2 10*3/uL (ref 0–0.7)
Eosinophils Relative: 3 %
HCT: 39 % (ref 35.0–47.0)
HEMOGLOBIN: 13 g/dL (ref 12.0–16.0)
LYMPHS ABS: 1.4 10*3/uL (ref 1.0–3.6)
LYMPHS PCT: 18 %
MCH: 30.1 pg (ref 26.0–34.0)
MCHC: 33.5 g/dL (ref 32.0–36.0)
MCV: 90 fL (ref 80.0–100.0)
Monocytes Absolute: 1.3 10*3/uL — ABNORMAL HIGH (ref 0.2–0.9)
Monocytes Relative: 17 %
NEUTROS PCT: 62 %
Neutro Abs: 4.7 10*3/uL (ref 1.4–6.5)
Platelets: 250 10*3/uL (ref 150–440)
RBC: 4.33 MIL/uL (ref 3.80–5.20)
RDW: 14 % (ref 11.5–14.5)
WBC: 7.7 10*3/uL (ref 3.6–11.0)

## 2016-01-12 LAB — TROPONIN I: Troponin I: <0.03 ng/mL (ref <0.031)

## 2016-01-12 LAB — URINALYSIS COMPLETE WITH MICROSCOPIC (ARMC ONLY)
BILIRUBIN URINE: NEGATIVE
Bacteria, UA: NONE SEEN
Glucose, UA: NEGATIVE mg/dL
Hgb urine dipstick: NEGATIVE
Ketones, ur: NEGATIVE mg/dL
Nitrite: NEGATIVE
PH: 7 (ref 5.0–8.0)
Protein, ur: NEGATIVE mg/dL
Specific Gravity, Urine: 1.015 (ref 1.005–1.030)

## 2016-01-12 MED ORDER — CEPHALEXIN 500 MG PO CAPS: 500.0000 mg | ORAL_CAPSULE | Freq: Three times a day (TID) | ORAL | Status: DC

## 2016-01-12 MED ORDER — CEPHALEXIN 500 MG PO CAPS
500.0000 mg | ORAL_CAPSULE | Freq: Once | ORAL | Status: AC
  Administered 2016-01-12: 500 mg via ORAL
  Filled 2016-01-12: qty 1

## 2016-01-12 NOTE — ED Notes (Signed)
[facility] called to make sure the pt is NOT sent back via cab and only goes back via EMS, as she was sent back in a cab the last time she was seen in our ED.

## 2016-01-12 NOTE — ED Notes (Signed)
MD at bedside. 

## 2016-01-12 NOTE — ED Notes (Signed)
Report given to [facility] rn at [facility].  Pt states she wants to go back in hospital gown.  Clothes sent with pt.

## 2016-01-12 NOTE — ED Notes (Signed)
Pt resting quietly.  Pt alert.  Skin warm and dry.  nsr on monitor.  Iv in place  siderails up x 2.

## 2016-01-12 NOTE — ED Notes (Signed)
Blood drawn and left at bedside, blood cultures drawn during IV start at left forearm

## 2016-01-12 NOTE — ED Notes (Signed)
From [facility], brought in by [facility] for being "just sick in general"  about a week ago, staff states she had URI, light fever, was prescribed z-pack but is still having a lot of coughing.  lung sounds show rhonci in bases, 94% room air/ 154/64, temp 98.5.  Main complain is just feeling real bad and heavy coughing.

## 2016-01-12 NOTE — ED Notes (Signed)
Pt waiting on ems ffor transport to [facility].  Unable to sign electronic chart.  Pt alert.

## 2016-01-12 NOTE — Discharge Instructions (Signed)
Please seek medical attention for any high fevers, chest pain, shortness of breath, change in behavior, persistent vomiting, bloody stool or any other new or concerning symptoms.   Urinary Tract Infection A urinary tract infection (UTI) can occur any place along the urinary tract. The tract includes the kidneys, ureters, bladder, and urethra. A type of germ called bacteria often causes a UTI. UTIs are often helped with antibiotic medicine.  HOME CARE   If given, take antibiotics as told by your doctor. Finish them even if you start to feel better.  Drink enough fluids to keep your pee (urine) clear or pale yellow.  Avoid tea, drinks with caffeine, and bubbly (carbonated) drinks.  Pee often. Avoid holding your pee in for a long time.  Pee before and after having sex (intercourse).  Wipe from front to back after you poop (bowel movement) if you are a woman. Use each tissue only once. GET HELP RIGHT AWAY IF:   You have back pain.  You have lower belly (abdominal) pain.  You have chills.  You feel sick to your stomach (nauseous).  You throw up (vomit).  Your burning or discomfort with peeing does not go away.  You have a fever.  Your symptoms are not better in 3 days. MAKE SURE YOU:   Understand these instructions.  Will watch your condition.  Will get help right away if you are not doing well or get worse.   This information is not intended to replace advice given to you by your health care provider. Make sure you discuss any questions you have with your health care provider.   Document Released: 03/21/2008 Document Revised: 10/24/2014 Document Reviewed: 05/03/2012 Elsevier Interactive Patient Education [employer].

## 2016-01-12 NOTE — ED Provider Notes (Signed)
[facility] Emergency Department Provider Note    ____________________________________________  Time seen: ~1840  I have reviewed the triage vital signs and the nursing notes.   HISTORY  Chief Complaint Cough and Fatigue   History limited by: poor historian   HPI Amber Reese is a 82 y.o. female who presents from living facility because of feeling sick. Patient is unfortunately a poor story K gave great history but it sounds like she has not been feeling well for the past couple of days. She has had a cough. She denies any shortness of breath or chest pain. She denies any abdominal pain nausea or vomiting. Simply states she is not feeling well. No fevers.    Past Medical History  Diagnosis Date  . Hypertension   . Chronic kidney disease   . Glaucoma   . Osteoarthritis   . Hyperlipidemia   . Thyroid disease   . Depression     There are no active problems to display for this patient.   Past Surgical History  Procedure Laterality Date  . Corneal transplant      Current Outpatient Rx  Name  Route  Sig  Dispense  Refill  . aspirin EC 81 MG tablet   Oral   Take 1 tablet (81 mg total) by mouth daily.   30 tablet   0   . bimatoprost (LUMIGAN) 0.01 % SOLN   Left Eye   Place 1 drop into the left eye at bedtime.         Marland Kitchen levothyroxine (SYNTHROID, LEVOTHROID) 50 MCG tablet   Oral   Take 1 tablet (50 mcg total) by mouth daily.   90 tablet   3   . nortriptyline (PAMELOR) 50 MG capsule   Oral   Take 1 capsule (50 mg total) by mouth 2 (two) times daily.   60 capsule   0   . sodium chloride (MURO 128) 2 % ophthalmic solution   Left Eye   Place 1 drop into the left eye 2 (two) times daily. To be taken at least 30 minutes before/after prescription drops.         . timolol (BETIMOL) 0.5 % ophthalmic solution   Left Eye   Place 1 drop into the left eye 2 (two) times daily.         Marland Kitchen tolterodine (DETROL LA) 4 MG 24 hr capsule    Oral   Take 1 capsule (4 mg total) by mouth daily.   30 capsule   0     Allergies Amoxicillin  No family history on file.  Social History Social History  Substance Use Topics  . Smoking status: Never Smoker   . Smokeless tobacco: None  . Alcohol Use: No    Review of Systems  Constitutional: Negative for fever. Cardiovascular: Negative for chest pain. Respiratory: Negative for shortness of breath. Gastrointestinal: Negative for abdominal pain, vomiting and diarrhea. Neurological: Negative for headaches, focal weakness or numbness.  10-point ROS otherwise negative.  ____________________________________________   PHYSICAL EXAM:  VITAL SIGNS: ED Triage Vitals  Enc Vitals Group     BP --      Pulse --      Resp --      Temp --      Temp src --      SpO2 --      Weight 01/12/16 1714 145 lb (65.772 kg)     Height 01/12/16 1714 5' (1.524 m)   Constitutional: Awake, alert, pleasant. Eyes:  Conjunctivae are normal. PERRL. Normal extraocular movements. ENT   Head: Normocephalic and atraumatic.   Nose: No congestion/rhinnorhea.   Mouth/Throat: Mucous membranes are moist.   Neck: No stridor. Hematological/Lymphatic/Immunilogical: No cervical lymphadenopathy. Cardiovascular: Normal rate, regular rhythm.  No murmurs, rubs, or gallops. Respiratory: Normal respiratory effort without tachypnea nor retractions. Breath sounds are clear and equal bilaterally. No wheezes/rales/rhonchi. Gastrointestinal: Soft and nontender. No distention.  Genitourinary: Deferred Musculoskeletal: Normal range of motion in all extremities. No joint effusions.  No lower extremity tenderness nor edema. Neurologic:  Normal speech and language. Not completely oriented to events. Moves all extremities. Sensation grossly intact. Skin:  Skin is warm, dry and intact. No rash noted.   ____________________________________________    LABS (pertinent positives/negatives)  Labs Reviewed   CBC WITH DIFFERENTIAL/PLATELET - Abnormal; Notable for the following:    Monocytes Absolute 1.3 (*)    All other components within normal limits  BASIC METABOLIC PANEL - Abnormal; Notable for the following:    Glucose, Bld 110 (*)    BUN 23 (*)    Creatinine, Ser 1.33 (*)    GFR calc non Af Amer 33 (*)    GFR calc Af Amer 38 (*)    All other components within normal limits  URINALYSIS COMPLETEWITH MICROSCOPIC ([facility] ONLY) - Abnormal; Notable for the following:    Color, Urine YELLOW (*)    APPearance HAZY (*)    Leukocytes, UA 3+ (*)    Squamous Epithelial / LPF 0-5 (*)    All other components within normal limits  TROPONIN I     ____________________________________________   EKG  I, Nance Pear, attending physician, personally viewed and interpreted this EKG  EKG Time: 1719 Rate: 92 Rhythm: normal sinus rhythm with 1st degree AV block Axis: left axis deviation Intervals: qtc 472 QRS: narrow, q waves V1, V2 ST changes: no st elevation Impression: abnormal ekg   ____________________________________________    RADIOLOGY  CXR IMPRESSION: No active cardiopulmonary disease.  ____________________________________________   PROCEDURES  Procedure(s) performed: None  Critical Care performed: No  ____________________________________________   INITIAL IMPRESSION / ASSESSMENT AND PLAN / ED COURSE  Pertinent labs & imaging results that were available during my care of the patient were reviewed by me and considered in my medical decision making (see chart for details).  Patient presented to the emergency department today because of concerns for not feeling well. Blood work without any concerning findings. Urine is consistent with urinary tract infection. Will plan on giving patient Keflex. Patient does have allergic to amoxicillin however states it was a long time ago and it does not sound like this anaphylactic in nature. Will give first dose  here.  ____________________________________________   FINAL CLINICAL IMPRESSION(S) / ED DIAGNOSES  Final diagnoses:  UTI (lower urinary tract infection)  Weakness     Nance Pear, MD 01/12/16 2010

## 2016-03-18 ENCOUNTER — Emergency Department
Admission: EM | Admit: 2016-03-18 | Discharge: 2016-03-18 | Disposition: A | Discharge status: Home / Self Care | Payer: Medicare Other | Attending: Emergency Medicine | Admitting: Emergency Medicine

## 2016-03-18 ENCOUNTER — Emergency Department: Payer: Medicare Other

## 2016-03-18 DIAGNOSIS — S0003XA Contusion of scalp, initial encounter: Secondary | ICD-10-CM | POA: Insufficient documentation

## 2016-03-18 DIAGNOSIS — W1800XA Striking against unspecified object with subsequent fall, initial encounter: Secondary | ICD-10-CM | POA: Insufficient documentation

## 2016-03-18 DIAGNOSIS — Y939 Activity, unspecified: Secondary | ICD-10-CM | POA: Diagnosis not present

## 2016-03-18 DIAGNOSIS — M1990 Unspecified osteoarthritis, unspecified site: Secondary | ICD-10-CM | POA: Insufficient documentation

## 2016-03-18 DIAGNOSIS — Y999 Unspecified external cause status: Secondary | ICD-10-CM | POA: Diagnosis not present

## 2016-03-18 DIAGNOSIS — E785 Hyperlipidemia, unspecified: Secondary | ICD-10-CM | POA: Insufficient documentation

## 2016-03-18 DIAGNOSIS — Y929 Unspecified place or not applicable: Secondary | ICD-10-CM | POA: Diagnosis not present

## 2016-03-18 DIAGNOSIS — I129 Hypertensive chronic kidney disease with stage 1 through stage 4 chronic kidney disease, or unspecified chronic kidney disease: Secondary | ICD-10-CM | POA: Diagnosis not present

## 2016-03-18 DIAGNOSIS — N189 Chronic kidney disease, unspecified: Secondary | ICD-10-CM | POA: Diagnosis not present

## 2016-03-18 DIAGNOSIS — Z79899 Other long term (current) drug therapy: Secondary | ICD-10-CM | POA: Diagnosis not present

## 2016-03-18 DIAGNOSIS — F329 Major depressive disorder, single episode, unspecified: Secondary | ICD-10-CM | POA: Insufficient documentation

## 2016-03-18 DIAGNOSIS — S0990XA Unspecified injury of head, initial encounter: Secondary | ICD-10-CM | POA: Diagnosis present

## 2016-03-18 DIAGNOSIS — Z7982 Long term (current) use of aspirin: Secondary | ICD-10-CM | POA: Diagnosis not present

## 2016-03-18 LAB — URINALYSIS COMPLETE WITH MICROSCOPIC (ARMC ONLY)
Bacteria, UA: NONE SEEN
Bilirubin Urine: NEGATIVE
GLUCOSE, UA: NEGATIVE mg/dL
Ketones, ur: NEGATIVE mg/dL
Nitrite: NEGATIVE
Protein, ur: NEGATIVE mg/dL
SPECIFIC GRAVITY, URINE: 1.018 (ref 1.005–1.030)
pH: 5 (ref 5.0–8.0)

## 2016-03-18 LAB — BASIC METABOLIC PANEL
Anion gap: 4 — ABNORMAL LOW (ref 5–15)
BUN: 27 mg/dL — ABNORMAL HIGH (ref 6–20)
CALCIUM: 9.6 mg/dL (ref 8.9–10.3)
CO2: 30 mmol/L (ref 22–32)
CREATININE: 1.22 mg/dL — AB (ref 0.44–1.00)
Chloride: 107 mmol/L (ref 101–111)
GFR, EST AFRICAN AMERICAN: 42 mL/min — AB (ref >60)
GFR, EST NON AFRICAN AMERICAN: 36 mL/min — AB (ref >60)
Glucose, Bld: 89 mg/dL (ref 65–99)
Potassium: 4.6 mmol/L (ref 3.5–5.1)
SODIUM: 141 mmol/L (ref 135–145)

## 2016-03-18 LAB — CBC
HCT: 42.1 % (ref 35.0–47.0)
Hemoglobin: 13.8 g/dL (ref 12.0–16.0)
MCH: 30.6 pg (ref 26.0–34.0)
MCHC: 32.7 g/dL (ref 32.0–36.0)
MCV: 93.5 fL (ref 80.0–100.0)
PLATELETS: 203 10*3/uL (ref 150–440)
RBC: 4.5 MIL/uL (ref 3.80–5.20)
RDW: 14.8 % — ABNORMAL HIGH (ref 11.5–14.5)
WBC: 7.8 10*3/uL (ref 3.6–11.0)

## 2016-03-18 LAB — TROPONIN I

## 2016-03-18 NOTE — ED Notes (Signed)
Spoke to Griffin, Therapist, sports from [place]. Will send transportation to pick pt up.

## 2016-03-18 NOTE — ED Notes (Signed)
Pt came to ED via EMS from independent living at [facility]. Pt reports her legs giving out and falling. No LOC. Pt has hematoma to back of head. Pt reports having increased weakness for the past few weeks.

## 2016-03-18 NOTE — Discharge Instructions (Signed)
You were evaluated after fall, and have a bruise to the top of her scalp called a hematoma. There should be examined evaluation emergency department stay are reassuring.  Return to the emergency department for any worsening condition including any new or worsening weakness or numbness, confusion or altered mental status, chest or abdominal pain, trouble walking, or any other symptoms concerning to you.   Facial or Scalp Contusion A facial or scalp contusion is a deep bruise on the face or head. Injuries to the face and head generally cause a lot of swelling, especially around the eyes. Contusions are the result of an injury that caused bleeding under the skin. The contusion may turn blue, purple, or yellow. Minor injuries will give you a painless contusion, but more severe contusions may stay painful and swollen for a few weeks.  CAUSES  A facial or scalp contusion is caused by a blunt injury or trauma to the face or head area.  SIGNS AND SYMPTOMS   Swelling of the injured area.   Discoloration of the injured area.   Tenderness, soreness, or pain in the injured area.  DIAGNOSIS  The diagnosis can be made by taking a medical history and doing a physical exam. An X-ray exam, CT scan, or MRI may be needed to determine if there are any associated injuries, such as broken bones (fractures). TREATMENT  Often, the best treatment for a facial or scalp contusion is applying cold compresses to the injured area. Over-the-counter medicines may also be recommended for pain control.  HOME CARE INSTRUCTIONS   Only take over-the-counter or prescription medicines as directed by your health care provider.   Apply ice to the injured area.   Put ice in a plastic bag.   Place a towel between your skin and the bag.   Leave the ice on for 20 minutes, 2-3 times a day.  SEEK MEDICAL CARE IF:  You have bite problems.   You have pain with chewing.   You are concerned about facial defects. SEEK  IMMEDIATE MEDICAL CARE IF:  You have severe pain or a headache that is not relieved by medicine.   You have unusual sleepiness, confusion, or personality changes.   You throw up (vomit).   You have a persistent nosebleed.   You have double vision or blurred vision.   You have fluid drainage from your nose or ear.   You have difficulty walking or using your arms or legs.  MAKE SURE YOU:   Understand these instructions.  Will watch your condition.  Will get help right away if you are not doing well or get worse.   This information is not intended to replace advice given to you by your health care provider. Make sure you discuss any questions you have with your health care provider.   Document Released: 11/10/2004 Document Revised: 10/24/2014 Document Reviewed: 05/16/2013 Elsevier Interactive Patient Education [employer].

## 2016-03-18 NOTE — ED Provider Notes (Signed)
[facility] Emergency Department Provider Note   ____________________________________________  Time seen: Approximately 11:30 AM I have reviewed the triage vital signs and the triage nursing note.  HISTORY  Chief Complaint Fall and Weakness   Historian Patient  HPI Amber Reese is a 82 y.o. female who lives at [facility] [place], is presenting here for evaluation after falling and striking her head. Patient states that after breakfast she was going to grab something from the fruit bowl and felt little bit dizzyand ended up stumbling and falling over and striking the back part of her head. She is complaining of a little bit of soreness at the site where she struck her head. Denies neck pain. She states that she is a little sore on the right side of her buttock/hip, but has no problem walking back to her room.  Her nurse came and evaluated her and suggested that she might need a head CT and sent her to the ER for further evaluation.  Patient states that terms of the dizziness, she had no chest pressure, palpitations, focal weakness or numbness, and that she has had intermittent episodes of dizziness like this and would not have otherwise came to the ED for the minor dizziness. Denies room spinning.  It was reported that she has had some confusion over the past couple months, with frequent falls over the past couple months.    Past Medical History  Diagnosis Date  . Hypertension   . Chronic kidney disease   . Glaucoma   . Osteoarthritis   . Hyperlipidemia   . Thyroid disease   . Depression     There are no active problems to display for this patient.   Past Surgical History  Procedure Laterality Date  . Corneal transplant      Current Outpatient Rx  Name  Route  Sig  Dispense  Refill  . aspirin EC 81 MG tablet   Oral   Take 1 tablet (81 mg total) by mouth daily.   30 tablet   0   . busPIRone (BUSPAR) 7.5 MG tablet   Oral   Take 7.5 mg by mouth daily.  Pt is to take 1 tablet daily through 6/2 and then increase to 1 tablet twice daily thereafter.         . Difluprednate 0.05 % EMUL   Right Eye   Place 1 drop into the right eye 4 (four) times daily.         Marland Kitchen erythromycin ophthalmic ointment   Both Eyes   Place 1 application into both eyes daily as needed (for redness).         Marland Kitchen latanoprost (XALATAN) 0.005 % ophthalmic solution   Left Eye   Place 1 drop into the left eye at bedtime.         Marland Kitchen levothyroxine (SYNTHROID, LEVOTHROID) 50 MCG tablet   Oral   Take 1 tablet (50 mcg total) by mouth daily. Patient taking differently: Take 50-75 mcg by mouth daily. Pt takes 49mcg on Sundays, Mondays, Wednesdays, Thursdays, and Fridays. Pt takes 82mcg on Tuesdays and saturdays.   90 tablet   3   . nortriptyline (PAMELOR) 50 MG capsule   Oral   Take 1 capsule (50 mg total) by mouth 2 (two) times daily.   60 capsule   0   . polyethylene glycol (MIRALAX / GLYCOLAX) packet   Oral   Take 17 g by mouth daily as needed for mild constipation.         Marland Kitchen  sodium chloride (MURO 128) 5 % ophthalmic solution   Left Eye   Place 1 drop into the left eye 2 (two) times daily. 30 minutes before or after prescription drops         . timolol (BETIMOL) 0.5 % ophthalmic solution   Left Eye   Place 1 drop into the left eye 2 (two) times daily.         Marland Kitchen tolterodine (DETROL LA) 4 MG 24 hr capsule   Oral   Take 1 capsule (4 mg total) by mouth daily.   30 capsule   0   . cephALEXin (KEFLEX) 500 MG capsule   Oral   Take 1 capsule (500 mg total) by mouth 3 (three) times daily. Patient not taking: Reported on 03/18/2016   30 capsule   0     Allergies Amoxicillin  No family history on file.  Social History Social History  Substance Use Topics  . Smoking status: Never Smoker   . Smokeless tobacco: None  . Alcohol Use: No    Review of Systems  Constitutional: Negative for fever. Eyes: Negative for visual changes. ENT: Negative  for sore throat. Cardiovascular: Negative for chest pain. Respiratory: Negative for shortness of breath. Gastrointestinal: Negative for abdominal pain, vomiting and diarrhea. Genitourinary: Negative for dysuria.History of recently treated urinary tract infection. States that she didn't really have symptoms when it was diagnosed either. Musculoskeletal: Negative for back pain. Skin: Negative for rash. Neurological: Negative for headache, but soreness at the site of the head bruise. 10 point Review of Systems otherwise negative ____________________________________________   PHYSICAL EXAM:  VITAL SIGNS: ED Triage Vitals  Enc Vitals Group     BP 03/18/16 1109 181/76 mmHg     Pulse Rate 03/18/16 1109 80     Resp 03/18/16 1109 12     Temp 03/18/16 1109 97.9 F (36.6 C)     Temp Source 03/18/16 1109 Oral     SpO2 03/18/16 1109 99 %     Weight --      Height --      Head Cir --      Peak Flow --      Pain Score --      Pain Loc --      Pain Edu? --      Excl. in [place]? --      Constitutional: Alert and oriented. Well appearing and in no distress. HEENT   Head: Scalp hematoma near the crown.      Eyes: Conjunctivae are normal. PERRL. Normal extraocular movements.      Ears:         Nose: No congestion/rhinnorhea.   Mouth/Throat: Mucous membranes are moist.   Neck: No stridor. Nontender cervical spine posteriorly to palpation and range of motion. Cardiovascular/Chest: Normal rate, regular rhythm.  No murmurs, rubs, or gallops. Respiratory: Normal respiratory effort without tachypnea nor retractions. Breath sounds are clear and equal bilaterally. No wheezes/rales/rhonchi. Gastrointestinal: Soft. No distention, no guarding, no rebound. Nontender.    Genitourinary/rectal:Deferred Musculoskeletal: Nontender with normal range of motion in all extremities. No joint effusions.  No lower extremity tenderness.  No edema.  Minimal tenderness at fatty tissue of the lateral hip/buttock  without bony point tenderness, or joint range of motion. Neurologic:  Normal speech and language. No gross or focal neurologic deficits are appreciated. Skin:  Skin is warm, dry and intact. No rash noted. Psychiatric: Mood and affect are normal. Speech and behavior are normal. Patient exhibits appropriate insight  and judgment.  ____________________________________________   EKG I, Lisa Roca, MD, the attending physician have personally viewed and interpreted all ECGs.  80 bpm. Normal sinus rhythm. First AV block. Narrow QRS. Normal axis. Normal ST and T-wave ____________________________________________  LABS (pertinent positives/negatives)  Labs Reviewed  BASIC METABOLIC PANEL - Abnormal; Notable for the following:    BUN 27 (*)    Creatinine, Ser 1.22 (*)    GFR calc non Af Amer 36 (*)    GFR calc Af Amer 42 (*)    Anion gap 4 (*)    All other components within normal limits  CBC - Abnormal; Notable for the following:    RDW 14.8 (*)    All other components within normal limits  URINALYSIS COMPLETEWITH MICROSCOPIC ([facility] ONLY) - Abnormal; Notable for the following:    Color, Urine YELLOW (*)    APPearance CLEAR (*)    Hgb urine dipstick 1+ (*)    Leukocytes, UA 1+ (*)    Squamous Epithelial / LPF 0-5 (*)    All other components within normal limits  TROPONIN I    ____________________________________________  RADIOLOGY All Xrays were viewed by me. Imaging interpreted by Radiologist.  Ct head without contrast:  IMPRESSION: Age-related involutional change with no acute findings except for scalp hematoma __________________________________________  PROCEDURES  Procedure(s) performed: None  Critical Care performed: None  ____________________________________________   ED COURSE / ASSESSMENT AND PLAN  Pertinent labs & imaging results that were available during my care of the patient were reviewed by me and considered in my medical decision making (see chart for  details).   Patient is here essentially for the trauma of bowel for falling and striking her head. Although there is report that she has some dementia, she is alert and oriented here and I don't think she is distracted in any way and her C-spine is cleared clinically.   Although she is complaining of some soft tissue tenderness around her right buttock, she is able to walk and I don't suspect hip fracture.  Her head CT shows no traumatic injury, just the soft tissue hematoma.  In terms of the dizziness which caused her to fall, sound like this has been ongoing issue, and her evaluation here is reassuring.  I will go ahead and discharge her home.    CONSULTATIONS:   None   Patient / Family / Caregiver informed of clinical course, medical decision-making process, and agree with plan.  I discussed return precautions, follow-up instructions, and discharged instructions with patient and/or family.  ___________________________________________   FINAL CLINICAL IMPRESSION(S) / ED DIAGNOSES   Final diagnoses:  Scalp hematoma, initial encounter              Note: This dictation was prepared with Dragon dictation. Any transcriptional errors that result from this process are unintentional   Lisa Roca, MD 03/18/16 1327

## 2016-03-18 NOTE — ED Notes (Signed)
Leafy Ro, RN from [facility] [id number]. Would like to be updated on pts status. Reports pt has been having some intermittent confusion, weakness for the past 2 months, 4 falls this month.

## 2016-03-18 NOTE — ED Notes (Signed)
Pt transported to CT ?

## 2016-05-08 ENCOUNTER — Emergency Department: Payer: Medicare Other

## 2016-05-08 ENCOUNTER — Emergency Department
Admission: EM | Admit: 2016-05-08 | Discharge: 2016-05-08 | Disposition: A | Discharge status: Home / Self Care | Payer: Medicare Other | Attending: Emergency Medicine | Admitting: Emergency Medicine

## 2016-05-08 ENCOUNTER — Encounter: Payer: Self-pay | Admitting: Emergency Medicine

## 2016-05-08 DIAGNOSIS — Z7982 Long term (current) use of aspirin: Secondary | ICD-10-CM | POA: Diagnosis not present

## 2016-05-08 DIAGNOSIS — Y929 Unspecified place or not applicable: Secondary | ICD-10-CM | POA: Diagnosis not present

## 2016-05-08 DIAGNOSIS — Y939 Activity, unspecified: Secondary | ICD-10-CM | POA: Insufficient documentation

## 2016-05-08 DIAGNOSIS — Z792 Long term (current) use of antibiotics: Secondary | ICD-10-CM | POA: Insufficient documentation

## 2016-05-08 DIAGNOSIS — W1839XA Other fall on same level, initial encounter: Secondary | ICD-10-CM | POA: Diagnosis not present

## 2016-05-08 DIAGNOSIS — N189 Chronic kidney disease, unspecified: Secondary | ICD-10-CM | POA: Diagnosis not present

## 2016-05-08 DIAGNOSIS — Y92009 Unspecified place in unspecified non-institutional (private) residence as the place of occurrence of the external cause: Secondary | ICD-10-CM

## 2016-05-08 DIAGNOSIS — S6991XA Unspecified injury of right wrist, hand and finger(s), initial encounter: Secondary | ICD-10-CM | POA: Diagnosis present

## 2016-05-08 DIAGNOSIS — I129 Hypertensive chronic kidney disease with stage 1 through stage 4 chronic kidney disease, or unspecified chronic kidney disease: Secondary | ICD-10-CM | POA: Diagnosis not present

## 2016-05-08 DIAGNOSIS — Z79899 Other long term (current) drug therapy: Secondary | ICD-10-CM | POA: Insufficient documentation

## 2016-05-08 DIAGNOSIS — S52501A Unspecified fracture of the lower end of right radius, initial encounter for closed fracture: Secondary | ICD-10-CM | POA: Insufficient documentation

## 2016-05-08 DIAGNOSIS — S62101A Fracture of unspecified carpal bone, right wrist, initial encounter for closed fracture: Secondary | ICD-10-CM

## 2016-05-08 DIAGNOSIS — Y999 Unspecified external cause status: Secondary | ICD-10-CM | POA: Insufficient documentation

## 2016-05-08 DIAGNOSIS — S0990XA Unspecified injury of head, initial encounter: Secondary | ICD-10-CM | POA: Diagnosis not present

## 2016-05-08 DIAGNOSIS — S52611A Displaced fracture of right ulna styloid process, initial encounter for closed fracture: Secondary | ICD-10-CM | POA: Diagnosis not present

## 2016-05-08 DIAGNOSIS — W19XXXA Unspecified fall, initial encounter: Secondary | ICD-10-CM

## 2016-05-08 LAB — CBC WITH DIFFERENTIAL/PLATELET
BASOS ABS: 0 10*3/uL (ref 0–0.1)
BASOS PCT: 0 %
EOS ABS: 0.1 10*3/uL (ref 0–0.7)
Eosinophils Relative: 1 %
HEMATOCRIT: 42 % (ref 35.0–47.0)
HEMOGLOBIN: 14.5 g/dL (ref 12.0–16.0)
Lymphocytes Relative: 9 %
Lymphs Abs: 1 10*3/uL (ref 1.0–3.6)
MCH: 31.3 pg (ref 26.0–34.0)
MCHC: 34.6 g/dL (ref 32.0–36.0)
MCV: 90.4 fL (ref 80.0–100.0)
MONOS PCT: 14 %
Monocytes Absolute: 1.5 10*3/uL — ABNORMAL HIGH (ref 0.2–0.9)
NEUTROS ABS: 8.2 10*3/uL — AB (ref 1.4–6.5)
NEUTROS PCT: 76 %
Platelets: 189 10*3/uL (ref 150–440)
RBC: 4.65 MIL/uL (ref 3.80–5.20)
RDW: 14.2 % (ref 11.5–14.5)
WBC: 10.8 10*3/uL (ref 3.6–11.0)

## 2016-05-08 LAB — BASIC METABOLIC PANEL
ANION GAP: 7 (ref 5–15)
BUN: 26 mg/dL — ABNORMAL HIGH (ref 6–20)
CALCIUM: 9.9 mg/dL (ref 8.9–10.3)
CHLORIDE: 106 mmol/L (ref 101–111)
CO2: 27 mmol/L (ref 22–32)
CREATININE: 1.04 mg/dL — AB (ref 0.44–1.00)
GFR calc non Af Amer: 44 mL/min — ABNORMAL LOW (ref >60)
GFR, EST AFRICAN AMERICAN: 51 mL/min — AB (ref >60)
Glucose, Bld: 125 mg/dL — ABNORMAL HIGH (ref 65–99)
Potassium: 3.7 mmol/L (ref 3.5–5.1)
SODIUM: 140 mmol/L (ref 135–145)

## 2016-05-08 LAB — CK: CK TOTAL: 192 U/L (ref 38–234)

## 2016-05-08 MED ORDER — BUPIVACAINE HCL 0.5 % IJ SOLN
50.0000 mL | Freq: Once | INTRAMUSCULAR | Status: AC
  Administered 2016-05-08: 30 mL
  Filled 2016-05-08: qty 50

## 2016-05-08 MED ORDER — OXYCODONE-ACETAMINOPHEN 10-325 MG PO TABS: 1.0000 | ORAL_TABLET | Freq: Four times a day (QID) | ORAL | 0 refills | Status: DC | PRN

## 2016-05-08 MED ORDER — SODIUM CHLORIDE 0.9 % IV BOLUS (SEPSIS)
500.0000 mL | Freq: Once | INTRAVENOUS | Status: AC
  Administered 2016-05-08: 500 mL via INTRAVENOUS

## 2016-05-08 MED ORDER — BUPIVACAINE HCL (PF) 0.5 % IJ SOLN
INTRAMUSCULAR | Status: AC
  Filled 2016-05-08: qty 30

## 2016-05-08 MED ORDER — OXYCODONE-ACETAMINOPHEN 10-325 MG PO TABS: 1.0000 | ORAL_TABLET | Freq: Four times a day (QID) | ORAL | 0 refills | Status: AC | PRN

## 2016-05-08 NOTE — ED Triage Notes (Signed)
Pt presents to ED from [facility] via EMS c/o unwitness fall that happened late last night /early this am. Pt states she stayed on the floor all night until this morning. Pt has right wrist edema with questionable deformity. Pain 8/10

## 2016-05-08 NOTE — ED Provider Notes (Addendum)
[facility] Emergency Department Provider Note  ____________________________________________   I have reviewed the triage vital signs and the nursing notes.   HISTORY  Chief Complaint Fall and Wrist Injury    HPI Amber Reese is a 82 y.o. female presents today after a fall. She had a nonsyncopal fall. History of frequent falls. She states she injured her right wrist. She does not wish pain medication at this time. She states it feels comfortable with ice. She did not pass out she did hit her head and she found herself generally too weak to get off the ground. Patient does have a life alert bracelet but was unable to activated as it was on the bedside table. Patient has had no chest pain or shortness of breath this was not a syncopal fall. She does not believe she injured any other part of her body. She is awake and alert and has a recollection of a mechanical fall.     Past Medical History:  Diagnosis Date  . Chronic kidney disease   . Depression   . Glaucoma   . Hyperlipidemia   . Hypertension   . Osteoarthritis   . Thyroid disease     There are no active problems to display for this patient.   Past Surgical History:  Procedure Laterality Date  . CORNEAL TRANSPLANT      Current Outpatient Rx  . Order #: [id number] Class: No Print  . Order #: [id number] Class: Historical Med  . Order #: [id number] Class: Print  . Order #: [id number] Class: Historical Med  . Order #: [id number] Class: Historical Med  . Order #: [id number] Class: Historical Med  . Order #: [id number] Class: No Print  . Order #: [id number] Class: No Print  . Order #: [id number] Class: Historical Med  . Order #: [id number] Class: Historical Med  . Order #: [id number] Class: Historical Med  . Order #: [id number] Class: No Print    Allergies Amoxicillin  History reviewed. No pertinent family history.  Social History Social History  Substance Use Topics  . Smoking status: Never Smoker  .  Smokeless tobacco: Never Used  . Alcohol use No    Review of Systems Constitutional: No fever/chills Eyes: No visual changes. ENT: No sore throat. No stiff neck no neck pain Cardiovascular: Denies chest pain. Respiratory: Denies shortness of breath. Gastrointestinal:   no vomiting.  No diarrhea.  No constipation. Genitourinary: Negative for dysuria. Musculoskeletal: Negative lower extremity swelling Skin: Negative for rash. Neurological: Negative for severe headaches, focal weakness or numbness. 10-point ROS otherwise negative.  ____________________________________________   PHYSICAL EXAM:  VITAL SIGNS: ED Triage Vitals  Enc Vitals Group     BP 05/08/16 0727 (!) 155/116     Pulse Rate 05/08/16 0727 94     Resp 05/08/16 0727 18     Temp 05/08/16 0727 98.3 F (36.8 C)     Temp Source 05/08/16 0727 Oral     SpO2 05/08/16 0727 95 %     Weight 05/08/16 0727 145 lb (65.8 kg)     Height 05/08/16 0727 5\' 2"  (1.575 m)     Head Circumference --      Peak Flow --      Pain Score 05/08/16 0728 8     Pain Loc --      Pain Edu? --      Excl. in [place]? --     Constitutional: Alert and oriented. Well appearing and in no acute distress. Eyes: Conjunctivae are normal. PERRL. EOMI. Head: Atraumatic. Nose: No congestion/rhinnorhea. Mouth/Throat: Mucous membranes  are moist.  Oropharynx non-erythematous. Neck: No stridor.   Nontender with no meningismus Cardiovascular: Normal rate, regular rhythm. Grossly normal heart sounds.  Good peripheral circulation. Respiratory: Normal respiratory effort.  No retractions. Lungs CTAB. Abdominal: Soft and nontender. No distention. No guarding no rebound Back:  There is no focal tenderness or step off.  there is no midline tenderness there are no lesions noted. there is no CVA tenderness Musculoskeletal: No lower extremity tenderness,No hip tenderness bilaterally positive left upper extremity tenderness in the left wrist . No joint effusions, no DVT  signs strong distal pulses no edema Neurologic:  Normal speech and language. No gross focal neurologic deficits are appreciated.  Skin:  Skin is warm, dry and intact. No rash noted. Psychiatric: Mood and affect are normal. Speech and behavior are normal.  ____________________________________________   LABS (all labs ordered are listed, but only abnormal results are displayed)  Labs Reviewed  CBC WITH DIFFERENTIAL/PLATELET - Abnormal; Notable for the following:       Result Value   Neutro Abs 8.2 (*)    Monocytes Absolute 1.5 (*)    All other components within normal limits  BASIC METABOLIC PANEL - Abnormal; Notable for the following:    Glucose, Bld 125 (*)    BUN 26 (*)    Creatinine, Ser 1.04 (*)    GFR calc non Af Amer 44 (*)    GFR calc Af Amer 51 (*)    All other components within normal limits  CK   ____________________________________________  EKG  I personally interpreted any EKGs ordered by me or triage  ____________________________________________  RADIOLOGY  I reviewed any imaging ordered by me or triage that were performed during my shift and, if possible, patient and/or family made aware of any abnormal findings. ____________________________________________   PROCEDURES  Procedure(s) performed:  Hematoma block, patient verbal consent obtained, sterile prep with Betadine, sterile gloves, after aspirating, 7 cc of Marcaine 0.5% was injected into the fracture region. Patient tolerated well. No bleeding no other complications noted. Good analgesia obtained  Procedures  Critical Care performed: None  ____________________________________________   INITIAL IMPRESSION / ASSESSMENT AND PLAN / ED COURSE  Pertinent labs & imaging results that were available during my care of the patient were reviewed by me and considered in my medical decision making (see chart for details).  Patient with a mechanical fall however she did spend the night on the floor. We'll  x-ray the right wrist is or some swelling there but it is unclear if it is broken. There are strong pulses. No compartment syndrome with Betadine. We'll also check total CK she spent the night on the floor and basic blood work and reassess.  Clinical Course  Value Comment By Time  DG Wrist Complete Right ----------------------------------------- 9:57 AM on @EDTODAY @ ----------------------------------------- Paging orthopedics. Will administer a hematoma block. No other acute injury noted. Pt is not in rhabdo.  Schuyler Amor, MD 07/23 0957  DG Wrist Complete Right (Reviewed) Schuyler Amor, MD 07/23 [phone]   ----------------------------------------- 10:17 AM on @EDTODAY @ ----------------------------------------- D/w Dr.krazinski, who asks me to perform hematoma block, and splint patient, which we will do.  Schuyler Amor, MD 07/23 1017   ____________________________________________   FINAL CLINICAL IMPRESSION(S) / ED DIAGNOSES  Final diagnoses:  None      This chart was dictated using voice recognition software.  Despite best efforts to proofread,  errors can occur which can change meaning.      Schuyler Amor, MD 05/08/16  [id number]    Schuyler Amor, MD 05/08/16 [place], MD 05/08/16 1019

## 2016-05-08 NOTE — ED Notes (Signed)
This nurse spoke with Gregary Cromer [phone]) over the phone who stated will transport patient back to [facility].

## 2016-05-17 DIAGNOSIS — S6291XA Unspecified fracture of right wrist and hand, initial encounter for closed fracture: Secondary | ICD-10-CM | POA: Diagnosis not present

## 2016-05-17 DIAGNOSIS — I10 Essential (primary) hypertension: Secondary | ICD-10-CM | POA: Diagnosis not present

## 2016-05-17 DIAGNOSIS — F39 Unspecified mood [affective] disorder: Secondary | ICD-10-CM | POA: Diagnosis not present

## 2016-05-17 DIAGNOSIS — G3184 Mild cognitive impairment, so stated: Secondary | ICD-10-CM | POA: Diagnosis not present

## 2016-06-23 DIAGNOSIS — F39 Unspecified mood [affective] disorder: Secondary | ICD-10-CM

## 2016-06-23 DIAGNOSIS — G3184 Mild cognitive impairment, so stated: Secondary | ICD-10-CM

## 2016-06-23 DIAGNOSIS — E039 Hypothyroidism, unspecified: Secondary | ICD-10-CM | POA: Diagnosis not present

## 2016-06-23 DIAGNOSIS — N3281 Overactive bladder: Secondary | ICD-10-CM | POA: Diagnosis not present

## 2016-06-23 DIAGNOSIS — S6291XA Unspecified fracture of right wrist and hand, initial encounter for closed fracture: Secondary | ICD-10-CM | POA: Diagnosis not present

## 2016-06-30 ENCOUNTER — Telehealth: Payer: Self-pay

## 2016-06-30 NOTE — Telephone Encounter (Signed)
Lars Mage pts son left v/m (do not see DPR); Mr Cheryle Horsfall [place] and is 3 hours behind our time here. Mr Diamantina Providence request R BaityNP to call him back around 11:00 am our time on 07/01/16. Pt's son has questions about how his mom was when left [facility] and also has some questions about side effects of her meds that pt seems to have those symptoms such as diarrhea.

## 2016-07-01 NOTE — Telephone Encounter (Signed)
Called son, LVOM

## 2016-10-06 ENCOUNTER — Other Ambulatory Visit: Payer: Self-pay | Admitting: Ophthalmology

## 2016-10-06 DIAGNOSIS — I770 Arteriovenous fistula, acquired: Secondary | ICD-10-CM

## 2016-10-12 ENCOUNTER — Ambulatory Visit
Admission: RE | Admit: 2016-10-12 | Discharge: 2016-10-12 | Disposition: A | Discharge status: Home / Self Care | Payer: Medicare Other | Source: Ambulatory Visit | Attending: Ophthalmology | Admitting: Ophthalmology

## 2016-10-12 DIAGNOSIS — I672 Cerebral atherosclerosis: Secondary | ICD-10-CM | POA: Insufficient documentation

## 2016-10-12 DIAGNOSIS — I770 Arteriovenous fistula, acquired: Secondary | ICD-10-CM | POA: Diagnosis present

## 2016-10-12 DIAGNOSIS — I671 Cerebral aneurysm, nonruptured: Secondary | ICD-10-CM | POA: Insufficient documentation

## 2016-10-12 LAB — POCT I-STAT CREATININE: CREATININE: 1.4 mg/dL — AB (ref 0.44–1.00)

## 2016-10-12 MED ORDER — IOPAMIDOL (ISOVUE-370) INJECTION 76%
60.0000 mL | Freq: Once | INTRAVENOUS | Status: AC | PRN
  Administered 2016-10-12: 60 mL via INTRAVENOUS

## 2016-10-12 MED ORDER — IOPAMIDOL (ISOVUE-370) INJECTION 76%: 75.0000 mL | Freq: Once | INTRAVENOUS | Status: DC | PRN

## 2016-10-26 DIAGNOSIS — H401231 Low-tension glaucoma, bilateral, mild stage: Secondary | ICD-10-CM | POA: Diagnosis not present

## 2016-10-26 DIAGNOSIS — H01006 Unspecified blepharitis left eye, unspecified eyelid: Secondary | ICD-10-CM | POA: Diagnosis not present

## 2016-10-26 DIAGNOSIS — H01003 Unspecified blepharitis right eye, unspecified eyelid: Secondary | ICD-10-CM | POA: Diagnosis not present

## 2016-10-26 DIAGNOSIS — H18423 Band keratopathy, bilateral: Secondary | ICD-10-CM | POA: Diagnosis not present

## 2016-10-26 DIAGNOSIS — H04123 Dry eye syndrome of bilateral lacrimal glands: Secondary | ICD-10-CM | POA: Diagnosis not present

## 2016-10-26 DIAGNOSIS — H18891 Other specified disorders of cornea, right eye: Secondary | ICD-10-CM | POA: Diagnosis not present

## 2016-10-26 DIAGNOSIS — H1851 Endothelial corneal dystrophy: Secondary | ICD-10-CM | POA: Diagnosis not present

## 2016-11-08 DIAGNOSIS — H401493 Capsular glaucoma with pseudoexfoliation of lens, unspecified eye, severe stage: Secondary | ICD-10-CM | POA: Diagnosis not present

## 2016-11-08 DIAGNOSIS — H1851 Endothelial corneal dystrophy: Secondary | ICD-10-CM | POA: Diagnosis not present

## 2016-11-16 DIAGNOSIS — H401493 Capsular glaucoma with pseudoexfoliation of lens, unspecified eye, severe stage: Secondary | ICD-10-CM | POA: Diagnosis not present

## 2016-11-16 DIAGNOSIS — H1851 Endothelial corneal dystrophy: Secondary | ICD-10-CM | POA: Diagnosis not present

## 2016-11-28 DIAGNOSIS — H00023 Hordeolum internum right eye, unspecified eyelid: Secondary | ICD-10-CM | POA: Diagnosis not present

## 2016-11-28 DIAGNOSIS — H04123 Dry eye syndrome of bilateral lacrimal glands: Secondary | ICD-10-CM | POA: Diagnosis not present

## 2016-11-28 DIAGNOSIS — H01006 Unspecified blepharitis left eye, unspecified eyelid: Secondary | ICD-10-CM | POA: Diagnosis not present

## 2016-11-28 DIAGNOSIS — H01003 Unspecified blepharitis right eye, unspecified eyelid: Secondary | ICD-10-CM | POA: Diagnosis not present

## 2016-11-28 DIAGNOSIS — H16213 Exposure keratoconjunctivitis, bilateral: Secondary | ICD-10-CM | POA: Diagnosis not present

## 2016-11-28 DIAGNOSIS — H18423 Band keratopathy, bilateral: Secondary | ICD-10-CM | POA: Diagnosis not present

## 2016-11-28 DIAGNOSIS — H00026 Hordeolum internum left eye, unspecified eyelid: Secondary | ICD-10-CM | POA: Diagnosis not present

## 2016-12-01 DIAGNOSIS — H02843 Edema of right eye, unspecified eyelid: Secondary | ICD-10-CM | POA: Diagnosis not present

## 2016-12-17 DIAGNOSIS — H53483 Generalized contraction of visual field, bilateral: Secondary | ICD-10-CM | POA: Diagnosis not present

## 2016-12-17 DIAGNOSIS — H3554 Dystrophies primarily involving the retinal pigment epithelium: Secondary | ICD-10-CM | POA: Diagnosis not present

## 2017-01-18 DIAGNOSIS — H5410 Blindness, one eye, low vision other eye, unspecified eyes: Secondary | ICD-10-CM | POA: Diagnosis not present

## 2017-01-21 DIAGNOSIS — H3554 Dystrophies primarily involving the retinal pigment epithelium: Secondary | ICD-10-CM | POA: Diagnosis not present

## 2017-01-21 DIAGNOSIS — H53483 Generalized contraction of visual field, bilateral: Secondary | ICD-10-CM | POA: Diagnosis not present

## 2017-02-02 DIAGNOSIS — I129 Hypertensive chronic kidney disease with stage 1 through stage 4 chronic kidney disease, or unspecified chronic kidney disease: Secondary | ICD-10-CM | POA: Diagnosis not present

## 2017-02-02 DIAGNOSIS — N183 Chronic kidney disease, stage 3 (moderate): Secondary | ICD-10-CM | POA: Diagnosis not present

## 2017-02-02 DIAGNOSIS — R300 Dysuria: Secondary | ICD-10-CM | POA: Diagnosis not present

## 2017-02-02 DIAGNOSIS — E039 Hypothyroidism, unspecified: Secondary | ICD-10-CM | POA: Diagnosis not present

## 2017-02-09 DIAGNOSIS — I700 Atherosclerosis of aorta: Secondary | ICD-10-CM | POA: Diagnosis not present

## 2017-02-09 DIAGNOSIS — I129 Hypertensive chronic kidney disease with stage 1 through stage 4 chronic kidney disease, or unspecified chronic kidney disease: Secondary | ICD-10-CM | POA: Diagnosis not present

## 2017-02-09 DIAGNOSIS — E039 Hypothyroidism, unspecified: Secondary | ICD-10-CM | POA: Diagnosis not present

## 2017-02-09 DIAGNOSIS — F325 Major depressive disorder, single episode, in full remission: Secondary | ICD-10-CM | POA: Diagnosis not present

## 2017-02-09 DIAGNOSIS — E782 Mixed hyperlipidemia: Secondary | ICD-10-CM | POA: Diagnosis not present

## 2017-02-09 DIAGNOSIS — N183 Chronic kidney disease, stage 3 (moderate): Secondary | ICD-10-CM | POA: Diagnosis not present

## 2017-02-11 DIAGNOSIS — H3554 Dystrophies primarily involving the retinal pigment epithelium: Secondary | ICD-10-CM | POA: Diagnosis not present

## 2017-02-11 DIAGNOSIS — H53483 Generalized contraction of visual field, bilateral: Secondary | ICD-10-CM | POA: Diagnosis not present

## 2017-02-27 DIAGNOSIS — Z947 Corneal transplant status: Secondary | ICD-10-CM | POA: Diagnosis not present

## 2017-02-27 DIAGNOSIS — Z961 Presence of intraocular lens: Secondary | ICD-10-CM | POA: Diagnosis not present

## 2017-03-07 DIAGNOSIS — H401113 Primary open-angle glaucoma, right eye, severe stage: Secondary | ICD-10-CM | POA: Diagnosis not present

## 2017-03-07 DIAGNOSIS — H401493 Capsular glaucoma with pseudoexfoliation of lens, unspecified eye, severe stage: Secondary | ICD-10-CM | POA: Diagnosis not present

## 2017-03-07 DIAGNOSIS — H1851 Endothelial corneal dystrophy: Secondary | ICD-10-CM | POA: Diagnosis not present

## 2017-03-17 DIAGNOSIS — H3554 Dystrophies primarily involving the retinal pigment epithelium: Secondary | ICD-10-CM | POA: Diagnosis not present

## 2017-03-17 DIAGNOSIS — H53483 Generalized contraction of visual field, bilateral: Secondary | ICD-10-CM | POA: Diagnosis not present

## 2017-03-23 ENCOUNTER — Encounter: Payer: Self-pay | Admitting: Emergency Medicine

## 2017-03-23 ENCOUNTER — Emergency Department
Admission: EM | Admit: 2017-03-23 | Discharge: 2017-03-23 | Disposition: A | Discharge status: Home / Self Care | Payer: Medicare Other | Attending: Emergency Medicine | Admitting: Emergency Medicine

## 2017-03-23 DIAGNOSIS — R531 Weakness: Secondary | ICD-10-CM | POA: Insufficient documentation

## 2017-03-23 DIAGNOSIS — Z79899 Other long term (current) drug therapy: Secondary | ICD-10-CM | POA: Diagnosis not present

## 2017-03-23 DIAGNOSIS — R55 Syncope and collapse: Secondary | ICD-10-CM | POA: Diagnosis not present

## 2017-03-23 DIAGNOSIS — I129 Hypertensive chronic kidney disease with stage 1 through stage 4 chronic kidney disease, or unspecified chronic kidney disease: Secondary | ICD-10-CM | POA: Diagnosis not present

## 2017-03-23 DIAGNOSIS — N189 Chronic kidney disease, unspecified: Secondary | ICD-10-CM | POA: Insufficient documentation

## 2017-03-23 DIAGNOSIS — I455 Other specified heart block: Secondary | ICD-10-CM

## 2017-03-23 DIAGNOSIS — Z7982 Long term (current) use of aspirin: Secondary | ICD-10-CM | POA: Insufficient documentation

## 2017-03-23 DIAGNOSIS — R6889 Other general symptoms and signs: Secondary | ICD-10-CM | POA: Diagnosis not present

## 2017-03-23 HISTORY — DX: Left ventricular failure, unspecified: I50.1

## 2017-03-23 HISTORY — DX: Polyosteoarthritis, unspecified: M15.9

## 2017-03-23 LAB — PHOSPHORUS: Phosphorus: 2.8 mg/dL (ref 2.5–4.6)

## 2017-03-23 LAB — CBC
HEMATOCRIT: 43.6 % (ref 35.0–47.0)
HEMOGLOBIN: 14.7 g/dL (ref 12.0–16.0)
MCH: 30.7 pg (ref 26.0–34.0)
MCHC: 33.8 g/dL (ref 32.0–36.0)
MCV: 90.9 fL (ref 80.0–100.0)
Platelets: 196 10*3/uL (ref 150–440)
RBC: 4.79 MIL/uL (ref 3.80–5.20)
RDW: 14.6 % — ABNORMAL HIGH (ref 11.5–14.5)
WBC: 8.6 10*3/uL (ref 3.6–11.0)

## 2017-03-23 LAB — BASIC METABOLIC PANEL
ANION GAP: 9 (ref 5–15)
BUN: 33 mg/dL — AB (ref 6–20)
CHLORIDE: 103 mmol/L (ref 101–111)
CO2: 29 mmol/L (ref 22–32)
Calcium: 9.9 mg/dL (ref 8.9–10.3)
Creatinine, Ser: 1.33 mg/dL — ABNORMAL HIGH (ref 0.44–1.00)
GFR calc non Af Amer: 33 mL/min — ABNORMAL LOW (ref >60)
GFR, EST AFRICAN AMERICAN: 38 mL/min — AB (ref >60)
GLUCOSE: 110 mg/dL — AB (ref 65–99)
POTASSIUM: 3.5 mmol/L (ref 3.5–5.1)
SODIUM: 141 mmol/L (ref 135–145)

## 2017-03-23 LAB — MAGNESIUM: Magnesium: 2.2 mg/dL (ref 1.7–2.4)

## 2017-03-23 LAB — TROPONIN I: Troponin I: <0.03 ng/mL (ref <0.03)

## 2017-03-23 LAB — GLUCOSE, CAPILLARY: Glucose-Capillary: 138 mg/dL — ABNORMAL HIGH (ref 65–99)

## 2017-03-23 NOTE — ED Notes (Signed)
Pt gave permission for this RN to speak with Luellen Pucker, Mudlogger of Nursing. This RN gave report to Luellen Pucker, Mudlogger of Nursing at [facility].

## 2017-03-23 NOTE — ED Notes (Signed)
NAD noted at time of D/C. Pt taken to [facility] by [place], transport for [employer]. Pt denies any comments/concerns regarding D/C. Pt taken to lobby via wheelchair by Carlynn Purl.

## 2017-03-23 NOTE — Discharge Instructions (Signed)
You were seen in the ER today for dizziness and generalized weakness. While you're being evaluated, you had an episode where your heart stopped for a few seconds causing you to pass out. We recommended admission and further evaluation, but you refused. It is likely that if this happens again, your heart may stop completely, causing you to die.  After a long discussion about your goals of care, this appears to be an acceptable event for you.  Return to the hospital immediately if you change your mind and wish to be admitted and have further treatment for this heart condition. Otherwise, please follow-up with your doctor and cardiologist as soon as possible.  Your blood tests today are unremarkable.

## 2017-03-23 NOTE — ED Notes (Signed)
NAD noted at this time. No change in patient condition. Pt resting in bed with eyes closed. Respirations even and unlabored. Remains in NSR on the monitor. Will continue to monitor for further patient needs.

## 2017-03-23 NOTE — ED Provider Notes (Addendum)
[facility] Emergency Department Provider Note  ____________________________________________  Time seen: Approximately 11:47 AM  I have reviewed the triage vital signs and the nursing notes.   HISTORY  Chief Complaint Weakness  Level 5 caveat:  Portions of the history and physical were unable to be obtained due to the patient's poor historian   HPI Amber Reese is a 83 y.o. female who complains of generalized weakness and dizziness today. She was in her usual state of health until during breakfast she suddenly felt very weak. Felt tingling in the left arm as well. No syncope. Denies chest pain or shortness of breath headache or belly pain.     Past Medical History:  Diagnosis Date  . Chronic kidney disease   . Depression   . Glaucoma   . Hyperlipidemia   . Hypertension   . Left ventricular failure ([place])   . Osteoarthritis   . Polyosteoarthritis   . Thyroid disease      There are no active problems to display for this patient.    Past Surgical History:  Procedure Laterality Date  . CORNEAL TRANSPLANT       Prior to Admission medications   Medication Sig Start Date End Date Taking? Authorizing Provider  acetaminophen (TYLENOL) 325 MG tablet Take 650 mg by mouth as needed for mild pain or moderate pain.   Yes [provider]  alum & mag hydroxide-simeth (MAALOX/MYLANTA) 200-200-20 MG/5ML suspension Take 30 mLs by mouth every 4 (four) hours as needed for indigestion or flatulence.   Yes [provider]  aspirin EC 81 MG tablet Take 1 tablet (81 mg total) by mouth daily. 12/26/14  Yes Baity, Coralie Keens, NP  bismuth subsalicylate (PEPTO BISMOL) 262 MG/15ML suspension Take 10 mLs by mouth 3 (three) times daily as needed for diarrhea or loose stools.   Yes [provider]  busPIRone (BUSPAR) 7.5 MG tablet Take 7.5 mg by mouth 2 (two) times daily.    Yes [provider]  carbamide peroxide (DEBROX) 6.5 % OTIC solution  Place 5 drops into both ears daily as needed. Instill 5 drops into affected ears daily for 5 days then irrigate/remove cerumen due to impaction as needed   Yes [provider]  carboxymethylcellulose (REFRESH PLUS) 0.5 % SOLN 1 drop 4 (four) times daily as needed.   Yes [provider]  dextromethorphan-guaiFENesin (ROBITUSSIN-DM) 10-100 MG/5ML liquid Take 10 mLs by mouth every 4 (four) hours as needed for cough.   Yes [provider]  Difluprednate 0.05 % EMUL Place 1 drop into the right eye 4 (four) times daily.   Yes [provider]  diphenhydrAMINE (BENADRYL) 25 MG tablet Take 25 mg by mouth every 4 (four) hours as needed for itching or allergies.   Yes [provider]  Ergocalciferol (VITAMIN D2) 2000 units TABS Take 2,000 Units by mouth daily.   Yes [provider]  Eyelid Cleansers (OCUSOFT EYELID CLEANSING EX) Apply 2 application topically. Use 2 applications every Monday, Wednesday and Friday. Use 1 pad per eye.   Yes [provider]  latanoprost (XALATAN) 0.005 % ophthalmic solution Place 1 drop into the left eye at bedtime.   Yes [provider]  levothyroxine (SYNTHROID, LEVOTHROID) 50 MCG tablet Take 1 tablet (50 mcg total) by mouth daily. Patient taking differently: Take 50-75 mcg by mouth daily. Pt takes 54mcg on Sundays, Mondays, Wednesdays, Thursdays, and Fridays. Pt takes 66mcg on Tuesdays and saturdays. 12/26/14  Yes Jearld Fenton, NP  magnesium hydroxide (MILK OF MAGNESIA) 400 MG/5ML suspension Take 30 mLs by mouth daily as needed for mild constipation.   Yes [provider]  nortriptyline (PAMELOR) 50 MG capsule Take 1 capsule (50 mg total) by mouth 2 (two) times daily. Patient taking differently: Take 50 mg by mouth at bedtime.  12/26/14  Yes Jearld Fenton, NP  nystatin (MYCOSTATIN/NYSTOP) powder Apply 2 g topically 2 (two) times daily.   Yes [provider]  polyethylene glycol (MIRALAX /  GLYCOLAX) packet Take 17 g by mouth daily as needed for mild constipation.   Yes [provider]  sodium chloride (MURO 128) 5 % ophthalmic solution Place 1 drop into the left eye 2 (two) times daily. 30 minutes before or after prescription drops   Yes [provider]  timolol (BETIMOL) 0.5 % ophthalmic solution Place 1 drop into the left eye 2 (two) times daily.   Yes [provider]  cephALEXin (KEFLEX) 500 MG capsule Take 1 capsule (500 mg total) by mouth 3 (three) times daily. Patient not taking: Reported on 03/18/2016 01/12/16   Nance Pear, MD  erythromycin ophthalmic ointment Place 1 application into both eyes daily as needed (for redness).    [provider]  oxyCODONE-acetaminophen (PERCOCET) 10-325 MG tablet Take 1 tablet by mouth every 6 (six) hours as needed for pain. 05/08/16 05/08/17  Schuyler Amor, MD  tolterodine (DETROL LA) 4 MG 24 hr capsule Take 1 capsule (4 mg total) by mouth daily. 12/26/14   Jearld Fenton, NP     Allergies Amoxicillin   History reviewed. No pertinent family history.  Social History Social History  Substance Use Topics  . Smoking status: Never Smoker  . Smokeless tobacco: Never Used  . Alcohol use No    Review of Systems  Constitutional:   No fever or chills.  ENT:   No sore throat. No rhinorrhea. Cardiovascular:   No chest pain or syncope. Respiratory:   No dyspnea or cough. Gastrointestinal:   Negative for abdominal pain, vomiting and diarrhea.  Musculoskeletal:   Negative for focal pain or swelling All other systems reviewed and are negative except as documented above in ROS and HPI.  ____________________________________________   PHYSICAL EXAM:  VITAL SIGNS: ED Triage Vitals  Enc Vitals Group     BP 03/23/17 0949 (!) 165/84     Pulse Rate 03/23/17 0949 84     Resp 03/23/17 0949 16     Temp 03/23/17 0949 97.9 F (36.6 C)     Temp Source 03/23/17 0949 Oral     SpO2 03/23/17 0948 98 %      Weight 03/23/17 0949 146 lb (66.2 kg)     Height 03/23/17 0949 5' (1.524 m)     Head Circumference --      Peak Flow --      Pain Score --      Pain Loc --      Pain Edu? --      Excl. in [place]? --     Vital signs reviewed, nursing assessments reviewed.   Constitutional:   Alert and oriented. Ill-appearing, not in distress. Eyes:   No scleral icterus.  EOMI. No nystagmus. No conjunctival pallor. PERRL. ENT   Head:   Normocephalic and atraumatic.   Nose:   No congestion/rhinnorhea.    Mouth/Throat:   MMM, no pharyngeal erythema. No peritonsillar mass.    Neck:   No meningismus. Full ROM Hematological/Lymphatic/Immunilogical:   No cervical lymphadenopathy. Cardiovascular:  RRR. Symmetric bilateral radial and DP pulses.  No murmurs.  Respiratory:   Normal respiratory effort without tachypnea/retractions. Breath sounds are clear and equal bilaterally. No wheezes/rales/rhonchi. Gastrointestinal:   Soft and nontender. Non distended. There is no CVA tenderness.  No rebound, rigidity, or guarding. Genitourinary:   deferred Musculoskeletal:   Normal range of motion in all extremities. No joint effusions.  No lower extremity tenderness.  No edema. Neurologic:   Normal speech and language.  Motor grossly intact. No gross focal neurologic deficits are appreciated.  Skin:    Skin is warm, dry and intact. No rash noted.  No petechiae, purpura, or bullae.  ____________________________________________    LABS (pertinent positives/negatives) (all labs ordered are listed, but only abnormal results are displayed) Labs Reviewed  BASIC METABOLIC PANEL - Abnormal; Notable for the following:       Result Value   Glucose, Bld 110 (*)    BUN 33 (*)    Creatinine, Ser 1.33 (*)    GFR calc non Af Amer 33 (*)    GFR calc Af Amer 38 (*)    All other components within normal limits  CBC - Abnormal; Notable for the following:    RDW 14.6 (*)    All other components within normal limits   GLUCOSE, CAPILLARY - Abnormal; Notable for the following:    Glucose-Capillary 138 (*)    All other components within normal limits  MAGNESIUM  TROPONIN I  PHOSPHORUS  URINALYSIS, COMPLETE (UACMP) WITH MICROSCOPIC  CBG MONITORING, ED   ____________________________________________   EKG  Interpreted by me Sinus rhythm rate of 79, normal axis. First-degree AV block with PR interval 240 ms. Normal QRS and ST segments. Isolated T-wave inversion in aVL which is nonspecific  ____________________________________________    RADIOLOGY  No results found.  ____________________________________________   PROCEDURES Procedures  ____________________________________________   INITIAL IMPRESSION / ASSESSMENT AND PLAN / ED COURSE  Pertinent labs & imaging results that were available during my care of the patient were reviewed by me and considered in my medical decision making (see chart for details).    Clinical Course as of Mar 23 1146  Thu Mar 23, 2017  1011 Patient presents with generalized weakness and dizziness and intermittent left arm tingling sensation. While I was examining the patient, I noted her heart rate to be in the 30s on the monitor. At the time she said she felt dizzy. She then had a 4 second sinus pause during which time the patient lost consciousness and had myoclonic jerking. This was followed by another few pauses, overall a period of about 15 seconds with just 2 or 3 heartbeats before she returned to a sinus rhythm with a rate of about 80 again. Afterward, she stated she was starting to feel better again. I advised the patient what had happened and that her heart had almost stopped. CODE STATUS is DO NOT RESUSCITATE with appropriate documentation of bedside. I recommended she be hospitalized, but patient refuses. She states "at 61 I've led a full life. I'm ready to go". In having a long discussion with the patient about current scope of care, she does not want any  acute interventions, she does not want to be hospitalized. She is willing to have blood drawn to check electrolytes. We will continue to monitor in the ED in the meantime. The patient is oriented without disorders of thought content or process. She has medical decision-making capacity.  [PS]    Clinical Course User Index [PS] Joni Fears,  Doren Custard, MD     ----------------------------------------- 11:50 AM on 03/23/2017 -----------------------------------------  Workup negative. We'll discharge home, follow-up with primary care and cardiology. He'll be discharged [place], consistent with her current goals of care. She is advised that if she changes her mind and wishes to be treated for that she should return to the hospital immediately. She knows she is always welcome here. Prior to discharge and did review all her results with her and again confirmed that her goals of care remain the same. Again she clearly has medical decision-making capacity.  ____________________________________________   FINAL CLINICAL IMPRESSION(S) / ED DIAGNOSES  Final diagnoses:  Generalized weakness  Cardiac syncope  Sinus pause      New Prescriptions   No medications on file     Portions of this note were generated with dragon dictation software. Dictation errors may occur despite best attempts at proofreading.    Carrie Mew, MD 03/23/17 1151    Carrie Mew, MD 03/23/17 1246

## 2017-03-23 NOTE — ED Notes (Signed)
This RN called to bedside by MD. Per MD pt became bradycardic and unresponsive, this RN arrived to bedside after pt's HR had returned to NSR with HR at 79. Pt's DNR remains at bedside at this time. Pt continues to c/o feeling weak. Will continue to monitor for further patient needs.

## 2017-03-23 NOTE — ED Triage Notes (Signed)
Pt presents to ED via [facility] from [place] at [facility]. Per EMS pt had sudden onset L sided weakness lasting approx several minutes prior to resolving. Per EMS pt had equal grip strengths, + facial symmetry, no slurred speech, and was alert and oriented x 4. Pt arrives to [facility] alert and oriented x 4 with no slurred speech.

## 2017-03-23 NOTE — ED Notes (Signed)
NAD noted at this time. Pt resting in bed with eyes closed. Remains in NSR on the monitor at this time. Will continue to monitor for further patient needs.

## 2017-03-24 DIAGNOSIS — I119 Hypertensive heart disease without heart failure: Secondary | ICD-10-CM | POA: Diagnosis not present

## 2017-03-24 DIAGNOSIS — R55 Syncope and collapse: Secondary | ICD-10-CM | POA: Diagnosis not present

## 2017-03-24 DIAGNOSIS — E782 Mixed hyperlipidemia: Secondary | ICD-10-CM | POA: Diagnosis not present

## 2017-03-24 DIAGNOSIS — Z87898 Personal history of other specified conditions: Secondary | ICD-10-CM | POA: Diagnosis not present

## 2017-03-24 DIAGNOSIS — R001 Bradycardia, unspecified: Secondary | ICD-10-CM | POA: Diagnosis not present

## 2017-03-24 DIAGNOSIS — I129 Hypertensive chronic kidney disease with stage 1 through stage 4 chronic kidney disease, or unspecified chronic kidney disease: Secondary | ICD-10-CM | POA: Diagnosis not present

## 2017-03-24 DIAGNOSIS — I455 Other specified heart block: Secondary | ICD-10-CM | POA: Diagnosis not present

## 2017-03-24 DIAGNOSIS — N183 Chronic kidney disease, stage 3 (moderate): Secondary | ICD-10-CM | POA: Diagnosis not present

## 2017-03-29 DIAGNOSIS — H53483 Generalized contraction of visual field, bilateral: Secondary | ICD-10-CM | POA: Diagnosis not present

## 2017-03-29 DIAGNOSIS — H3554 Dystrophies primarily involving the retinal pigment epithelium: Secondary | ICD-10-CM | POA: Diagnosis not present

## 2017-03-31 DIAGNOSIS — I119 Hypertensive heart disease without heart failure: Secondary | ICD-10-CM | POA: Diagnosis not present

## 2017-03-31 DIAGNOSIS — R55 Syncope and collapse: Secondary | ICD-10-CM | POA: Diagnosis not present

## 2017-03-31 DIAGNOSIS — I700 Atherosclerosis of aorta: Secondary | ICD-10-CM | POA: Diagnosis not present

## 2017-04-15 DIAGNOSIS — H3554 Dystrophies primarily involving the retinal pigment epithelium: Secondary | ICD-10-CM | POA: Diagnosis not present

## 2017-04-15 DIAGNOSIS — H53483 Generalized contraction of visual field, bilateral: Secondary | ICD-10-CM | POA: Diagnosis not present

## 2017-04-18 DIAGNOSIS — I119 Hypertensive heart disease without heart failure: Secondary | ICD-10-CM | POA: Diagnosis not present

## 2017-04-18 DIAGNOSIS — N183 Chronic kidney disease, stage 3 (moderate): Secondary | ICD-10-CM | POA: Diagnosis not present

## 2017-04-18 DIAGNOSIS — I129 Hypertensive chronic kidney disease with stage 1 through stage 4 chronic kidney disease, or unspecified chronic kidney disease: Secondary | ICD-10-CM | POA: Diagnosis not present

## 2017-04-22 DIAGNOSIS — H3554 Dystrophies primarily involving the retinal pigment epithelium: Secondary | ICD-10-CM | POA: Diagnosis not present

## 2017-04-22 DIAGNOSIS — H53483 Generalized contraction of visual field, bilateral: Secondary | ICD-10-CM | POA: Diagnosis not present

## 2017-04-25 DIAGNOSIS — I119 Hypertensive heart disease without heart failure: Secondary | ICD-10-CM | POA: Diagnosis not present

## 2017-04-25 DIAGNOSIS — I129 Hypertensive chronic kidney disease with stage 1 through stage 4 chronic kidney disease, or unspecified chronic kidney disease: Secondary | ICD-10-CM | POA: Diagnosis not present

## 2017-04-25 DIAGNOSIS — I700 Atherosclerosis of aorta: Secondary | ICD-10-CM | POA: Diagnosis not present

## 2017-04-25 DIAGNOSIS — R42 Dizziness and giddiness: Secondary | ICD-10-CM | POA: Diagnosis not present

## 2017-04-25 DIAGNOSIS — N183 Chronic kidney disease, stage 3 (moderate): Secondary | ICD-10-CM | POA: Diagnosis not present

## 2017-04-25 DIAGNOSIS — E782 Mixed hyperlipidemia: Secondary | ICD-10-CM | POA: Diagnosis not present

## 2017-04-26 DIAGNOSIS — R001 Bradycardia, unspecified: Secondary | ICD-10-CM | POA: Diagnosis not present

## 2017-04-26 DIAGNOSIS — R42 Dizziness and giddiness: Secondary | ICD-10-CM | POA: Diagnosis not present

## 2017-04-26 DIAGNOSIS — R55 Syncope and collapse: Secondary | ICD-10-CM | POA: Diagnosis not present

## 2017-05-03 DIAGNOSIS — Z1283 Encounter for screening for malignant neoplasm of skin: Secondary | ICD-10-CM | POA: Diagnosis not present

## 2017-05-03 DIAGNOSIS — L853 Xerosis cutis: Secondary | ICD-10-CM | POA: Diagnosis not present

## 2017-05-03 DIAGNOSIS — D1800 Hemangioma unspecified site: Secondary | ICD-10-CM | POA: Diagnosis not present

## 2017-05-03 DIAGNOSIS — L720 Epidermal cyst: Secondary | ICD-10-CM | POA: Diagnosis not present

## 2017-05-03 DIAGNOSIS — I8393 Asymptomatic varicose veins of bilateral lower extremities: Secondary | ICD-10-CM | POA: Diagnosis not present

## 2017-05-03 DIAGNOSIS — L700 Acne vulgaris: Secondary | ICD-10-CM | POA: Diagnosis not present

## 2017-05-03 DIAGNOSIS — D229 Melanocytic nevi, unspecified: Secondary | ICD-10-CM | POA: Diagnosis not present

## 2017-05-03 DIAGNOSIS — L821 Other seborrheic keratosis: Secondary | ICD-10-CM | POA: Diagnosis not present

## 2017-05-03 DIAGNOSIS — L718 Other rosacea: Secondary | ICD-10-CM | POA: Diagnosis not present

## 2017-06-05 DIAGNOSIS — Z947 Corneal transplant status: Secondary | ICD-10-CM | POA: Diagnosis not present

## 2017-06-16 DIAGNOSIS — M1711 Unilateral primary osteoarthritis, right knee: Secondary | ICD-10-CM | POA: Diagnosis not present

## 2017-08-02 ENCOUNTER — Encounter: Payer: Self-pay | Admitting: Emergency Medicine

## 2017-08-02 ENCOUNTER — Emergency Department: Payer: Medicare Other

## 2017-08-02 ENCOUNTER — Inpatient Hospital Stay
Admission: EM | Admit: 2017-08-02 | Discharge: 2017-08-06 | DRG: 481 | Disposition: A | Discharge status: Skilled Nursing Facility | Payer: Medicare Other | Attending: Internal Medicine | Admitting: Internal Medicine

## 2017-08-02 DIAGNOSIS — S0990XA Unspecified injury of head, initial encounter: Secondary | ICD-10-CM | POA: Diagnosis not present

## 2017-08-02 DIAGNOSIS — F329 Major depressive disorder, single episode, unspecified: Secondary | ICD-10-CM | POA: Diagnosis present

## 2017-08-02 DIAGNOSIS — N189 Chronic kidney disease, unspecified: Secondary | ICD-10-CM | POA: Diagnosis present

## 2017-08-02 DIAGNOSIS — Z880 Allergy status to penicillin: Secondary | ICD-10-CM | POA: Diagnosis not present

## 2017-08-02 DIAGNOSIS — Z419 Encounter for procedure for purposes other than remedying health state, unspecified: Secondary | ICD-10-CM

## 2017-08-02 DIAGNOSIS — D62 Acute posthemorrhagic anemia: Secondary | ICD-10-CM | POA: Diagnosis not present

## 2017-08-02 DIAGNOSIS — Z79899 Other long term (current) drug therapy: Secondary | ICD-10-CM

## 2017-08-02 DIAGNOSIS — W010XXA Fall on same level from slipping, tripping and stumbling without subsequent striking against object, initial encounter: Secondary | ICD-10-CM | POA: Diagnosis present

## 2017-08-02 DIAGNOSIS — E039 Hypothyroidism, unspecified: Secondary | ICD-10-CM | POA: Diagnosis present

## 2017-08-02 DIAGNOSIS — I129 Hypertensive chronic kidney disease with stage 1 through stage 4 chronic kidney disease, or unspecified chronic kidney disease: Secondary | ICD-10-CM | POA: Diagnosis present

## 2017-08-02 DIAGNOSIS — S098XXA Other specified injuries of head, initial encounter: Secondary | ICD-10-CM | POA: Diagnosis not present

## 2017-08-02 DIAGNOSIS — H409 Unspecified glaucoma: Secondary | ICD-10-CM | POA: Diagnosis present

## 2017-08-02 DIAGNOSIS — R278 Other lack of coordination: Secondary | ICD-10-CM | POA: Diagnosis not present

## 2017-08-02 DIAGNOSIS — S72041A Displaced fracture of base of neck of right femur, initial encounter for closed fracture: Secondary | ICD-10-CM | POA: Diagnosis not present

## 2017-08-02 DIAGNOSIS — Z7982 Long term (current) use of aspirin: Secondary | ICD-10-CM

## 2017-08-02 DIAGNOSIS — R918 Other nonspecific abnormal finding of lung field: Secondary | ICD-10-CM | POA: Diagnosis not present

## 2017-08-02 DIAGNOSIS — M1990 Unspecified osteoarthritis, unspecified site: Secondary | ICD-10-CM | POA: Diagnosis present

## 2017-08-02 DIAGNOSIS — M6281 Muscle weakness (generalized): Secondary | ICD-10-CM | POA: Diagnosis not present

## 2017-08-02 DIAGNOSIS — S79911A Unspecified injury of right hip, initial encounter: Secondary | ICD-10-CM | POA: Diagnosis not present

## 2017-08-02 DIAGNOSIS — S7221XA Displaced subtrochanteric fracture of right femur, initial encounter for closed fracture: Principal | ICD-10-CM | POA: Diagnosis present

## 2017-08-02 DIAGNOSIS — M25551 Pain in right hip: Secondary | ICD-10-CM | POA: Diagnosis not present

## 2017-08-02 DIAGNOSIS — Z66 Do not resuscitate: Secondary | ICD-10-CM | POA: Diagnosis present

## 2017-08-02 DIAGNOSIS — M189 Osteoarthritis of first carpometacarpal joint, unspecified: Secondary | ICD-10-CM | POA: Diagnosis not present

## 2017-08-02 DIAGNOSIS — I10 Essential (primary) hypertension: Secondary | ICD-10-CM | POA: Diagnosis not present

## 2017-08-02 DIAGNOSIS — Z741 Need for assistance with personal care: Secondary | ICD-10-CM | POA: Diagnosis not present

## 2017-08-02 DIAGNOSIS — S72001A Fracture of unspecified part of neck of right femur, initial encounter for closed fracture: Secondary | ICD-10-CM

## 2017-08-02 DIAGNOSIS — S72001D Fracture of unspecified part of neck of right femur, subsequent encounter for closed fracture with routine healing: Secondary | ICD-10-CM | POA: Diagnosis not present

## 2017-08-02 DIAGNOSIS — I440 Atrioventricular block, first degree: Secondary | ICD-10-CM | POA: Diagnosis present

## 2017-08-02 DIAGNOSIS — Z8679 Personal history of other diseases of the circulatory system: Secondary | ICD-10-CM | POA: Diagnosis not present

## 2017-08-02 DIAGNOSIS — M810 Age-related osteoporosis without current pathological fracture: Secondary | ICD-10-CM | POA: Diagnosis not present

## 2017-08-02 DIAGNOSIS — W19XXXA Unspecified fall, initial encounter: Secondary | ICD-10-CM

## 2017-08-02 DIAGNOSIS — Y9301 Activity, walking, marching and hiking: Secondary | ICD-10-CM | POA: Diagnosis present

## 2017-08-02 DIAGNOSIS — Z7401 Bed confinement status: Secondary | ICD-10-CM | POA: Diagnosis not present

## 2017-08-02 DIAGNOSIS — Z9181 History of falling: Secondary | ICD-10-CM | POA: Diagnosis not present

## 2017-08-02 DIAGNOSIS — S72141A Displaced intertrochanteric fracture of right femur, initial encounter for closed fracture: Secondary | ICD-10-CM | POA: Diagnosis not present

## 2017-08-02 DIAGNOSIS — R262 Difficulty in walking, not elsewhere classified: Secondary | ICD-10-CM | POA: Diagnosis not present

## 2017-08-02 DIAGNOSIS — S72111A Displaced fracture of greater trochanter of right femur, initial encounter for closed fracture: Secondary | ICD-10-CM | POA: Diagnosis not present

## 2017-08-02 DIAGNOSIS — S72009A Fracture of unspecified part of neck of unspecified femur, initial encounter for closed fracture: Secondary | ICD-10-CM | POA: Diagnosis present

## 2017-08-02 LAB — TYPE AND SCREEN
ABO/RH(D): A POS
Antibody Screen: NEGATIVE

## 2017-08-02 LAB — CBC
HEMATOCRIT: 44.1 % (ref 35.0–47.0)
Hemoglobin: 14.7 g/dL (ref 12.0–16.0)
MCH: 31.2 pg (ref 26.0–34.0)
MCHC: 33.4 g/dL (ref 32.0–36.0)
MCV: 93.4 fL (ref 80.0–100.0)
PLATELETS: 197 10*3/uL (ref 150–440)
RBC: 4.73 MIL/uL (ref 3.80–5.20)
RDW: 14.7 % — ABNORMAL HIGH (ref 11.5–14.5)
WBC: 9.2 10*3/uL (ref 3.6–11.0)

## 2017-08-02 LAB — BASIC METABOLIC PANEL
ANION GAP: 11 (ref 5–15)
BUN: 31 mg/dL — AB (ref 6–20)
CHLORIDE: 103 mmol/L (ref 101–111)
CO2: 25 mmol/L (ref 22–32)
Calcium: 10.4 mg/dL — ABNORMAL HIGH (ref 8.9–10.3)
Creatinine, Ser: 1.33 mg/dL — ABNORMAL HIGH (ref 0.44–1.00)
GFR calc Af Amer: 38 mL/min — ABNORMAL LOW (ref >60)
GFR, EST NON AFRICAN AMERICAN: 33 mL/min — AB (ref >60)
GLUCOSE: 99 mg/dL (ref 65–99)
POTASSIUM: 4.1 mmol/L (ref 3.5–5.1)
Sodium: 139 mmol/L (ref 135–145)

## 2017-08-02 LAB — MRSA PCR SCREENING: MRSA BY PCR: NEGATIVE

## 2017-08-02 LAB — TSH: TSH: 2.068 u[IU]/mL (ref 0.350–4.500)

## 2017-08-02 LAB — PROTIME-INR
INR: 0.93
PROTHROMBIN TIME: 12.4 s (ref 11.4–15.2)

## 2017-08-02 LAB — APTT: aPTT: 26 seconds (ref 24–36)

## 2017-08-02 MED ORDER — MORPHINE SULFATE (PF) 2 MG/ML IV SOLN
2.0000 mg | INTRAVENOUS | Status: DC | PRN
  Administered 2017-08-02 – 2017-08-03 (×3): 2 mg via INTRAVENOUS
  Filled 2017-08-02 (×3): qty 1

## 2017-08-02 MED ORDER — LEVOTHYROXINE SODIUM 50 MCG PO TABS: 50.0000 ug | ORAL_TABLET | Freq: Every day | ORAL | Status: DC

## 2017-08-02 MED ORDER — DIFLUPREDNATE 0.05 % OP EMUL
1.0000 [drp] | Freq: Four times a day (QID) | OPHTHALMIC | Status: DC
  Administered 2017-08-02 – 2017-08-06 (×11): 1 [drp] via OPHTHALMIC
  Filled 2017-08-02: qty 5

## 2017-08-02 MED ORDER — CLINDAMYCIN PHOSPHATE 600 MG/50ML IV SOLN
600.0000 mg | Freq: Once | INTRAVENOUS | Status: AC
  Administered 2017-08-03: 600 mg via INTRAVENOUS
  Filled 2017-08-02 (×2): qty 50

## 2017-08-02 MED ORDER — ONDANSETRON HCL 4 MG PO TABS: 4.0000 mg | ORAL_TABLET | Freq: Four times a day (QID) | ORAL | Status: DC | PRN

## 2017-08-02 MED ORDER — LATANOPROST 0.005 % OP SOLN
1.0000 [drp] | Freq: Every day | OPHTHALMIC | Status: DC
  Administered 2017-08-02 – 2017-08-05 (×4): 1 [drp] via OPHTHALMIC
  Filled 2017-08-02: qty 2.5

## 2017-08-02 MED ORDER — SODIUM CHLORIDE 0.9 % IV SOLN
INTRAVENOUS | Status: AC
Start: 2017-08-02 — End: 2017-08-03
  Administered 2017-08-02 – 2017-08-03 (×2): via INTRAVENOUS

## 2017-08-02 MED ORDER — SODIUM CHLORIDE 0.9 % IV BOLUS (SEPSIS)
1000.0000 mL | Freq: Once | INTRAVENOUS | Status: AC
  Administered 2017-08-02: 1000 mL via INTRAVENOUS

## 2017-08-02 MED ORDER — ALUM & MAG HYDROXIDE-SIMETH 200-200-20 MG/5ML PO SUSP: 30.0000 mL | ORAL | Status: DC | PRN

## 2017-08-02 MED ORDER — DOCUSATE SODIUM 100 MG PO CAPS
100.0000 mg | ORAL_CAPSULE | Freq: Two times a day (BID) | ORAL | Status: DC
  Administered 2017-08-02: 100 mg via ORAL
  Filled 2017-08-02 (×2): qty 1

## 2017-08-02 MED ORDER — ONDANSETRON HCL 4 MG/2ML IJ SOLN
4.0000 mg | Freq: Once | INTRAMUSCULAR | Status: AC
  Administered 2017-08-02: 4 mg via INTRAVENOUS
  Filled 2017-08-02: qty 2

## 2017-08-02 MED ORDER — NYSTATIN 100000 UNIT/GM EX POWD
Freq: Two times a day (BID) | CUTANEOUS | Status: DC
  Administered 2017-08-02 – 2017-08-06 (×7): via TOPICAL
  Filled 2017-08-02: qty 15

## 2017-08-02 MED ORDER — LEVOTHYROXINE SODIUM 50 MCG PO TABS
50.0000 ug | ORAL_TABLET | ORAL | Status: DC
  Administered 2017-08-04 – 2017-08-06 (×2): 50 ug via ORAL
  Filled 2017-08-02 (×3): qty 1

## 2017-08-02 MED ORDER — SODIUM CHLORIDE (HYPERTONIC) 5 % OP SOLN
1.0000 [drp] | Freq: Two times a day (BID) | OPHTHALMIC | Status: DC
  Administered 2017-08-02 – 2017-08-06 (×7): 1 [drp] via OPHTHALMIC
  Filled 2017-08-02: qty 15

## 2017-08-02 MED ORDER — LEVOTHYROXINE SODIUM 50 MCG PO TABS
75.0000 ug | ORAL_TABLET | ORAL | Status: DC
  Administered 2017-08-05: 08:00:00 75 ug via ORAL
  Filled 2017-08-02: qty 1

## 2017-08-02 MED ORDER — POLYETHYLENE GLYCOL 3350 17 G PO PACK
17.0000 g | PACK | Freq: Every day | ORAL | Status: DC
  Administered 2017-08-04 – 2017-08-05 (×2): 17 g via ORAL
  Filled 2017-08-02 (×2): qty 1

## 2017-08-02 MED ORDER — VITAMIN D 1000 UNITS PO TABS
2000.0000 [IU] | ORAL_TABLET | Freq: Every day | ORAL | Status: DC
  Administered 2017-08-04 – 2017-08-06 (×3): 2000 [IU] via ORAL
  Filled 2017-08-02 (×4): qty 2

## 2017-08-02 MED ORDER — ACETAMINOPHEN 500 MG PO TABS: 500.0000 mg | ORAL_TABLET | Freq: Four times a day (QID) | ORAL | Status: DC | PRN

## 2017-08-02 MED ORDER — ONDANSETRON HCL 4 MG/2ML IJ SOLN: 4.0000 mg | Freq: Four times a day (QID) | INTRAMUSCULAR | Status: DC | PRN

## 2017-08-02 MED ORDER — TIMOLOL MALEATE 0.5 % OP SOLN
1.0000 [drp] | Freq: Two times a day (BID) | OPHTHALMIC | Status: DC
  Administered 2017-08-02 – 2017-08-06 (×7): 1 [drp] via OPHTHALMIC
  Filled 2017-08-02: qty 5

## 2017-08-02 NOTE — H&P (Addendum)
[place] [facility] [place] NAME: Amber Reese    MR#:  [id number]  DATE OF BIRTH:  06-27-1918  DATE OF ADMISSION:  08/02/2017  PRIMARY CARE PHYSICIAN: Kirk Ruths, MD   REQUESTING/REFERRING PHYSICIAN: Dr. Rudene Re  CHIEF COMPLAINT:   Chief Complaint  Patient presents with  . Fall  . Hip Pain    HISTORY OF PRESENT ILLNESS:  Amber Reese  is a 83 y.o. female with a known history of hypertension, hyperlipidemia, arthritis, CKD, glaucoma presents to hospital secondary to a fall and right hip pain. Patient is from assisted living part of [facility] [place]. Denies any known cardiac history. No exertional dyspnea or chest pain. Ambulates with the help of a walker. She was out with her family going for lunch to a restaurant, her walker caught onto the sidewalk crack and she had a mechanical fall onto her right side. Immediately after the fall she complained of pain and inability to bear weight on the right side. X-ray here shows comminuted fracture at the base of right femoral neck.  PAST MEDICAL HISTORY:   Past Medical History:  Diagnosis Date  . Chronic kidney disease   . Depression   . Glaucoma   . Hyperlipidemia   . Hypertension   . Left ventricular failure ([place])   . Osteoarthritis   . Polyosteoarthritis   . Thyroid disease     PAST SURGICAL HISTORY:   Past Surgical History:  Procedure Laterality Date  . CORNEAL TRANSPLANT      SOCIAL HISTORY:   Social History  Substance Use Topics  . Smoking status: Never Smoker  . Smokeless tobacco: Never Used  . Alcohol use No    FAMILY HISTORY:  History reviewed. No pertinent family history.  DRUG ALLERGIES:   Allergies  Allergen Reactions  . Amoxicillin     REVIEW OF SYSTEMS:   Review of Systems  Constitutional: Negative for chills, fever, malaise/fatigue and weight loss.  HENT: Negative for ear discharge, ear pain, hearing loss, nosebleeds and tinnitus.   Eyes:  Negative for blurred vision, double vision and photophobia.  Respiratory: Negative for cough, hemoptysis, shortness of breath and wheezing.   Cardiovascular: Negative for chest pain, palpitations, orthopnea and leg swelling.  Gastrointestinal: Negative for abdominal pain, constipation, diarrhea, heartburn, melena, nausea and vomiting.  Genitourinary: Negative for dysuria, frequency, hematuria and urgency.  Musculoskeletal: Positive for joint pain. Negative for back pain, myalgias and neck pain.  Skin: Negative for rash.  Neurological: Negative for dizziness, tingling, tremors, sensory change, speech change, focal weakness and headaches.  Endo/Heme/Allergies: Does not bruise/bleed easily.  Psychiatric/Behavioral: Negative for depression.    MEDICATIONS AT HOME:   Prior to Admission medications   Medication Sig Start Date End Date Taking? Authorizing Provider  acetaminophen (TYLENOL) 500 MG tablet Take 500 mg by mouth every 6 (six) hours as needed for mild pain or moderate pain.    Yes [provider]  alum & mag hydroxide-simeth (MAALOX/MYLANTA) 200-200-20 MG/5ML suspension Take 30 mLs by mouth every 4 (four) hours as needed for indigestion or flatulence.   Yes [provider]  aspirin EC 81 MG tablet Take 1 tablet (81 mg total) by mouth daily. 12/26/14  Yes Baity, Coralie Keens, NP  bismuth subsalicylate (PEPTO BISMOL) 262 MG/15ML suspension Take 10 mLs by mouth 3 (three) times daily as needed for diarrhea or loose stools.   Yes [provider]  busPIRone (BUSPAR) 7.5 MG tablet Take 7.5 mg by mouth  2 (two) times daily.    Yes [provider]  carbamide peroxide (DEBROX) 6.5 % OTIC solution Place 5 drops into both ears daily as needed. Instill 5 drops into affected ears daily for 5 days then irrigate/remove cerumen due to impaction as needed   Yes [provider]  carboxymethylcellulose (REFRESH PLUS) 0.5 % SOLN 1 drop 4 (four) times daily as needed.   Yes  [provider]  dextromethorphan-guaiFENesin (ROBITUSSIN-DM) 10-100 MG/5ML liquid Take 10 mLs by mouth every 4 (four) hours as needed for cough.   Yes [provider]  Difluprednate 0.05 % EMUL Place 1 drop into the right eye 4 (four) times daily.   Yes [provider]  diphenhydrAMINE (BENADRYL) 25 MG tablet Take 25 mg by mouth every 4 (four) hours as needed for itching or allergies.   Yes [provider]  Ergocalciferol (VITAMIN D2) 2000 units TABS Take 2,000 Units by mouth daily.   Yes [provider]  Eyelid Cleansers (OCUSOFT EYELID CLEANSING EX) Apply 2 application topically. Use 2 applications every Monday, Wednesday and Friday. Use 1 pad per eye.   Yes [provider]  latanoprost (XALATAN) 0.005 % ophthalmic solution Place 1 drop into the left eye at bedtime.   Yes [provider]  levothyroxine (SYNTHROID, LEVOTHROID) 50 MCG tablet Take 1 tablet (50 mcg total) by mouth daily. Patient taking differently: Take 50-75 mcg by mouth daily. Pt takes 61mcg on Sundays, Mondays, Wednesdays, Thursdays, and Fridays. Pt takes 52mcg on Tuesdays and saturdays. 12/26/14  Yes Baity, Coralie Keens, NP  magnesium hydroxide (MILK OF MAGNESIA) 400 MG/5ML suspension Take 30 mLs by mouth daily as needed for mild constipation.   Yes [provider]  nortriptyline (PAMELOR) 50 MG capsule Take 1 capsule (50 mg total) by mouth 2 (two) times daily. Patient taking differently: Take 50 mg by mouth at bedtime.  12/26/14  Yes Jearld Fenton, NP  nystatin (MYCOSTATIN/NYSTOP) powder Apply topically 2 (two) times daily. Apply twice daily under breasts and to groin/abdominal fold areas as needed for rash.   Yes [provider]  polyethylene glycol (MIRALAX / GLYCOLAX) packet Take 17 g by mouth daily.    Yes [provider]  sodium chloride (MURO 128) 5 % ophthalmic solution Place 1 drop into the left eye 2 (two) times daily. 30 minutes before  or after prescription drops   Yes [provider]  timolol (BETIMOL) 0.5 % ophthalmic solution Place 1 drop into the left eye 2 (two) times daily.   Yes [provider]  cephALEXin (KEFLEX) 500 MG capsule Take 1 capsule (500 mg total) by mouth 3 (three) times daily. Patient not taking: Reported on 03/18/2016 01/12/16   Nance Pear, MD  tolterodine (DETROL LA) 4 MG 24 hr capsule Take 1 capsule (4 mg total) by mouth daily. Patient not taking: Reported on 03/23/2017 12/26/14   Jearld Fenton, NP      VITAL SIGNS:  Blood pressure (!) 175/124, pulse 94, temperature 98.6 F (37 C), temperature source Oral, resp. rate 20, height 4\' 9"  (1.448 m), weight 68.9 kg (152 lb), SpO2 98 %.  PHYSICAL EXAMINATION:   Physical Exam  GENERAL:  83 y.o.-year-old patient lying in the bed with no acute distress.  EYES: left Pupil round, reactive to light and accommodation. Right eye opacified cornea. No scleral icterus. Extraocular muscles intact.  HEENT: Head atraumatic, normocephalic. Oropharynx and nasopharynx clear.  NECK:  Supple, no jugular venous distention. No thyroid enlargement, no tenderness.  LUNGS: Normal breath sounds bilaterally, no wheezing, rales,rhonchi or crepitation. No use of accessory muscles of respiration.  CARDIOVASCULAR: S1, S2 normal. No  rubs, or gallops. 3/6 systolic murmur is present ABDOMEN: Soft, nontender, nondistended. Bowel sounds present. No organomegaly or mass.  EXTREMITIES: No pedal edema, cyanosis, or clubbing. Right hip pain, right leg is shortened and abducted NEUROLOGIC: Cranial nerves II through XII are intact. Muscle strength 5/5 in all extremities except limited rotation of right hip due to pain. Sensation intact. Gait not checked.  PSYCHIATRIC: The patient is alert and oriented x 3.  SKIN: No obvious rash, lesion, or ulcer.   LABORATORY PANEL:   CBC  Recent Labs Lab 08/02/17 1859  WBC 9.2  HGB 14.7  HCT 44.1  PLT 197    ------------------------------------------------------------------------------------------------------------------  Chemistries   Recent Labs Lab 08/02/17 1859  NA 139  K 4.1  CL 103  CO2 25  GLUCOSE 99  BUN 31*  CREATININE 1.33*  CALCIUM 10.4*   ------------------------------------------------------------------------------------------------------------------  Cardiac Enzymes No results for input(s): TROPONINI in the last 168 hours. ------------------------------------------------------------------------------------------------------------------  RADIOLOGY:  Dg Chest 1 View  Result Date: 08/02/2017 CLINICAL DATA:  Hip fracture EXAM: CHEST 1 VIEW COMPARISON:  01/12/2016 FINDINGS: Mild cardiomegaly with aortic atherosclerosis. Mild diffuse interstitial opacity likely chronic change. No consolidation or effusion. No pneumothorax. IMPRESSION: Mild cardiomegaly.  Negative for edema or infiltrate. Electronically Signed   By: Donavan Foil M.D.   On: 08/02/2017 19:18   Ct Head Wo Contrast  Result Date: 08/02/2017 CLINICAL DATA:  Trauma to the right side of head after trip and fall today. EXAM: CT HEAD WITHOUT CONTRAST TECHNIQUE: Contiguous axial images were obtained from the base of the skull through the vertex without intravenous contrast. COMPARISON:  None. FINDINGS: BRAIN: There is sulcal and ventricular prominence consistent with superficial and central atrophy. No intraparenchymal hemorrhage, mass effect nor midline shift. 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 Periventricular and subcortical white matter hypodensities consistent with chronic small vessel ischemic disease are identified. No acute large vascular territory infarcts. No abnormal extra-axial fluid collections. Basal cisterns are not effaced and midline. VASCULAR: Marked calcific atherosclerosis of the carotid siphons. No hyperdense vessels. SKULL: No skull fracture. No  significant scalp soft tissue swelling. SINUSES/ORBITS: The mastoid air-cells are clear. The included paranasal sinuses are well-aerated.The included ocular globes and orbital contents are non-suspicious. Right cataract extraction and glaucoma reservoir is again seen. OTHER: None. IMPRESSION: 1. Atrophy with chronic small vessel ischemic disease. Remote infarct of the right lenticulostriate. No acute intracranial abnormality. 2. Re- demonstration of extensor atherosclerotic calcifications at the skullbase. Electronically Signed   By: Ashley Royalty M.D.   On: 08/02/2017 19:40   Dg Hip Unilat  With Pelvis 2-3 Views Right  Result Date: 08/02/2017 CLINICAL DATA:  Fall with shortening of the like EXAM: DG HIP (WITH OR WITHOUT PELVIS) 2-3V RIGHT COMPARISON:  None. FINDINGS: Pubic symphysis is intact.  No fracture or dislocation on the left. Acute, comminuted and displaced fracture involving the base of the femoral neck and the trochanter. Laterally displaced greater trochanteric fracture fragment. IMPRESSION: Acute, comminuted and displaced right proximal femoral fracture involving the base of the femoral neck and the trochanter. Electronically Signed   By: Donavan Foil M.D.   On: 08/02/2017 19:17    EKG:   Orders placed or performed during the hospital encounter of 08/02/17  . ED EKG  . ED EKG    IMPRESSION AND PLAN:   Amber Reese  Amber Reese  is a 83 y.o. female with a known history of hypertension, hyperlipidemia, arthritis, CKD, glaucoma presents to hospital secondary to a fall and right hip pain.  #1 right femoral neck fracture-secondary to mechanical fall. -Patient does not have any known cardiac disease, no chest pain on exertion or no dyspnea. -Other than her age, no other limiting factors for surgery. -Considering risks and benefits, can proceed with surgery.No other testing needed. -Monitor hemoglobin after surgery. Physical therapy will be started after surgery -Orthopedics consulted. Pain control at  this time. -Likely will need rehabilitation at discharge.  #2 hypothyroidism-continue Synthroid  #3 glaucoma-continue eyedrops  #4 DVT prophylaxis-will be started after surgery  Social worker consult.    All the records are reviewed and case discussed with ED provider. Management plans discussed with the patient, family and they are in agreement.  CODE STATUS: DNR- present in chart  TOTAL TIME TAKING CARE OF THIS PATIENT: 50 minutes.    Bodhi Stenglein M.D on 08/02/2017 at 8:35 PM  Between 7am to 6pm - Pager - [phone]  After 6pm go to [email or web address] - password [email or web address] [place] [facility]  Office  [phone]  CC: Primary care physician; Kirk Ruths, MD

## 2017-08-02 NOTE — ED Triage Notes (Addendum)
Pt arrived via EMS from [facility] s/p fall. Pt was walking with son with her walker when she tripped on a crack in the sidewalk. Pt fell on right side and has obvious deformity and rotation to the right side. Pt denied pain medication with EMS and is compressed in a sheet at this time. Pt is alert and oriented.

## 2017-08-02 NOTE — ED Notes (Signed)
Dr. Kalisetti at bedside. 

## 2017-08-02 NOTE — ED Provider Notes (Signed)
[facility] Emergency Department Provider Note  ____________________________________________  Time seen: Approximately 7:16 PM  I have reviewed the triage vital signs and the nursing notes.   HISTORY  Chief Complaint Fall and Hip Pain   HPI Amber Reese is a 83 y.o. female with a history of hypertension, hyperlipidemia, osteoarthritis who presents for evaluation of right hip pain status post mechanical fall. Patient was going to restaurant with her son when her walker got caught on a crack on the sidewalk and patient fell. She fell onto her right side and is complaining of severe, constant, nonradiating pain in her right hip that is worse with movement of her leg. She has been unable to ambulate. She also reports that she hit her head on the ground however denies headache, neck pain or back pain. No LOC. No blood thinners. Patient denies any other injuries at this time.  Past Medical History:  Diagnosis Date  . Chronic kidney disease   . Depression   . Glaucoma   . Hyperlipidemia   . Hypertension   . Left ventricular failure ([place])   . Osteoarthritis   . Polyosteoarthritis   . Thyroid disease     There are no active problems to display for this patient.   Past Surgical History:  Procedure Laterality Date  . CORNEAL TRANSPLANT      Prior to Admission medications   Medication Sig Start Date End Date Taking? Authorizing Provider  acetaminophen (TYLENOL) 500 MG tablet Take 500 mg by mouth every 6 (six) hours as needed for mild pain or moderate pain.    Yes [provider]  alum & mag hydroxide-simeth (MAALOX/MYLANTA) 200-200-20 MG/5ML suspension Take 30 mLs by mouth every 4 (four) hours as needed for indigestion or flatulence.   Yes [provider]  aspirin EC 81 MG tablet Take 1 tablet (81 mg total) by mouth daily. 12/26/14  Yes Baity, Coralie Keens, NP  bismuth subsalicylate (PEPTO BISMOL) 262 MG/15ML suspension Take 10 mLs by mouth 3  (three) times daily as needed for diarrhea or loose stools.   Yes [provider]  busPIRone (BUSPAR) 7.5 MG tablet Take 7.5 mg by mouth 2 (two) times daily.    Yes [provider]  carbamide peroxide (DEBROX) 6.5 % OTIC solution Place 5 drops into both ears daily as needed. Instill 5 drops into affected ears daily for 5 days then irrigate/remove cerumen due to impaction as needed   Yes [provider]  carboxymethylcellulose (REFRESH PLUS) 0.5 % SOLN 1 drop 4 (four) times daily as needed.   Yes [provider]  dextromethorphan-guaiFENesin (ROBITUSSIN-DM) 10-100 MG/5ML liquid Take 10 mLs by mouth every 4 (four) hours as needed for cough.   Yes [provider]  Difluprednate 0.05 % EMUL Place 1 drop into the right eye 4 (four) times daily.   Yes [provider]  diphenhydrAMINE (BENADRYL) 25 MG tablet Take 25 mg by mouth every 4 (four) hours as needed for itching or allergies.   Yes [provider]  Ergocalciferol (VITAMIN D2) 2000 units TABS Take 2,000 Units by mouth daily.   Yes [provider]  Eyelid Cleansers (OCUSOFT EYELID CLEANSING EX) Apply 2 application topically. Use 2 applications every Monday, Wednesday and Friday. Use 1 pad per eye.   Yes [provider]  latanoprost (XALATAN) 0.005 % ophthalmic solution Place 1 drop into the left eye at bedtime.   Yes [provider]  levothyroxine (SYNTHROID, LEVOTHROID) 50 MCG tablet Take 1  tablet (50 mcg total) by mouth daily. Patient taking differently: Take 50-75 mcg by mouth daily. Pt takes 77mcg on Sundays, Mondays, Wednesdays, Thursdays, and Fridays. Pt takes 59mcg on Tuesdays and saturdays. 12/26/14  Yes Baity, Coralie Keens, NP  magnesium hydroxide (MILK OF MAGNESIA) 400 MG/5ML suspension Take 30 mLs by mouth daily as needed for mild constipation.   Yes [provider]  nortriptyline (PAMELOR) 50 MG capsule Take 1 capsule (50 mg total) by mouth 2 (two)  times daily. Patient taking differently: Take 50 mg by mouth at bedtime.  12/26/14  Yes Jearld Fenton, NP  nystatin (MYCOSTATIN/NYSTOP) powder Apply topically 2 (two) times daily. Apply twice daily under breasts and to groin/abdominal fold areas as needed for rash.   Yes [provider]  polyethylene glycol (MIRALAX / GLYCOLAX) packet Take 17 g by mouth daily.    Yes [provider]  sodium chloride (MURO 128) 5 % ophthalmic solution Place 1 drop into the left eye 2 (two) times daily. 30 minutes before or after prescription drops   Yes [provider]  timolol (BETIMOL) 0.5 % ophthalmic solution Place 1 drop into the left eye 2 (two) times daily.   Yes [provider]  cephALEXin (KEFLEX) 500 MG capsule Take 1 capsule (500 mg total) by mouth 3 (three) times daily. Patient not taking: Reported on 03/18/2016 01/12/16   Nance Pear, MD  tolterodine (DETROL LA) 4 MG 24 hr capsule Take 1 capsule (4 mg total) by mouth daily. Patient not taking: Reported on 03/23/2017 12/26/14   Jearld Fenton, NP    Allergies Amoxicillin  History reviewed. No pertinent family history.  Social History Social History  Substance Use Topics  . Smoking status: Never Smoker  . Smokeless tobacco: Never Used  . Alcohol use No    Review of Systems Constitutional: Negative for fever. Eyes: Negative for visual changes. ENT: Negative for facial injury or neck injury Cardiovascular: Negative for chest injury. Respiratory: Negative for shortness of breath. Negative for chest wall injury. Gastrointestinal: Negative for abdominal pain or injury. Genitourinary: Negative for dysuria. Musculoskeletal: Negative for back injury. + R hip pain. Skin: Negative for laceration/abrasions. Neurological: + head injury.   ____________________________________________   PHYSICAL EXAM:  VITAL SIGNS: ED Triage Vitals  Enc Vitals Group     BP 08/02/17 1828 (!) 175/124     Pulse Rate  08/02/17 1828 94     Resp 08/02/17 1828 20     Temp 08/02/17 1828 98.6 F (37 C)     Temp Source 08/02/17 1828 Oral     SpO2 08/02/17 1828 98 %     Weight 08/02/17 1829 152 lb (68.9 kg)     Height 08/02/17 1829 4\' 9"  (1.448 m)     Head Circumference --      Peak Flow --      Pain Score 08/02/17 1828 4     Pain Loc --      Pain Edu? --      Excl. in [place]? --    Full spinal precautions maintained throughout the trauma exam. Constitutional: Alert and oriented. No acute distress. Does not appear intoxicated. HEENT Head: Normocephalic and atraumatic. Face: No facial bony tenderness. Stable midface Ears: No hemotympanum bilaterally. No Battle sign Eyes: No eye injury. PERRL. No raccoon eyes Nose: Nontender. No epistaxis. No rhinorrhea Mouth/Throat: Mucous membranes are moist. No oropharyngeal blood. No dental injury. Airway patent without stridor. Normal voice. Neck: no C-collar in place. No midline c-spine  tenderness.  Cardiovascular: Normal rate, regular rhythm. Normal and symmetric distal pulses are present in all extremities. Pulmonary/Chest: Chest wall is stable and nontender to palpation/compression. Normal respiratory effort. Breath sounds are normal. No crepitus.  Abdominal: Soft, nontender, non distended. Musculoskeletal: right lower extremity shortened and externally rotated with significant tenderness to palpation of the right hip.Nontender with normal full range of motion in all other extremities. No deformities. No thoracic or lumbar midline spinal tenderness. Pelvis is stable. Skin: Skin is warm, dry and intact. No abrasions or contutions. Psychiatric: Speech and behavior are appropriate. Neurological: Normal speech and language. Moves all extremities to command. No gross focal neurologic deficits are appreciated.  Glascow Coma Score: 4 - Opens eyes on own 6 - Follows simple motor commands 5 - Alert and oriented GCS: 15   ____________________________________________     LABS (all labs ordered are listed, but only abnormal results are displayed)  Labs Reviewed  CBC - Abnormal; Notable for the following:       Result Value   RDW 14.7 (*)    All other components within normal limits  BASIC METABOLIC PANEL - Abnormal; Notable for the following:    BUN 31 (*)    Creatinine, Ser 1.33 (*)    Calcium 10.4 (*)    GFR calc non Af Amer 33 (*)    GFR calc Af Amer 38 (*)    All other components within normal limits  PROTIME-INR  APTT  TYPE AND SCREEN   ____________________________________________  EKG  ED ECG REPORT I, Rudene Re, the attending physician, personally viewed and interpreted this ECG.  Normal sinus rhythm, rate of 92, first-degree AV block, normal QRS and QTc intervals, normal axis, no ST elevations or depressions. Q waves in anterior leads. unchanged from prior from June 2018 ____________________________________________  RADIOLOGY  HIP XR:  Acute, comminuted and displaced right proximal femoral fracture involving the base of the femoral neck and the trochanter.  CXR: Mild cardiomegaly. Negative for edema or infiltrate ____________________________________________   PROCEDURES  Procedure(s) performed: None Procedures Critical Care performed:  None ____________________________________________   INITIAL IMPRESSION / ASSESSMENT AND PLAN / ED COURSE  83 y.o. female with a history of hypertension, hyperlipidemia, osteoarthritis who presents for evaluation of right hip pain status post mechanical fall. patient has a shortened and externally rotated right lower extremity concerning for hip fracture. X-rays pending. We'll also send patient for head CT since she also hit her head. Her head is atraumatic with no signs or symptoms of basilar skull fracture. We'll give morphine for the pain.     _________________________ 7:52 PM on 08/02/2017 -----------------------------------------  hip x-ray concerning for a right hip fracture.  Head CT negative. Discussed with Dr. Roland Rack, orthopedics on call who will plan on surgical repair tomorrow morning. Patient is post be nothing by mouth at midnight. Discussed with the hospitalist for admission. Patient has been ordered morphine 2 mg every hour for pain. Labs unchanged from baseline other than hypercalcemia with a calcium of 10.4. We'll give IV fluids.   As part of my medical decision making, I reviewed the following data within the [place] History obtained from family, Nursing notes reviewed and incorporated, Labs reviewed , EKG interpreted , Old EKG reviewed, Old chart reviewed, Radiograph reviewed , Discussed with admitting physician , A consult was requested and obtained from this/these consultant(s) Orthopedics, Notes from prior ED visits and [place] Controlled Substance Database    Pertinent labs & imaging results that were available during  my care of the patient were reviewed by me and considered in my medical decision making (see chart for details).    ____________________________________________   FINAL CLINICAL IMPRESSION(S) / ED DIAGNOSES  Final diagnoses:  Fall, initial encounter  Closed fracture of right hip, initial encounter ([place])      NEW MEDICATIONS STARTED DURING THIS VISIT:  New Prescriptions   No medications on file     Note:  This document was prepared using Dragon voice recognition software and may include unintentional dictation errors.    Rudene Re, MD 08/02/17 [phone]

## 2017-08-03 ENCOUNTER — Inpatient Hospital Stay: Payer: Medicare Other | Admitting: Certified Registered Nurse Anesthetist

## 2017-08-03 ENCOUNTER — Encounter
Admission: EM | Disposition: A | Discharge status: Skilled Nursing Facility | Payer: Self-pay | Source: Home / Self Care | Attending: Internal Medicine

## 2017-08-03 ENCOUNTER — Encounter: Payer: Self-pay | Admitting: Anesthesiology

## 2017-08-03 ENCOUNTER — Inpatient Hospital Stay: Payer: Medicare Other

## 2017-08-03 HISTORY — PX: INTRAMEDULLARY (IM) NAIL INTERTROCHANTERIC: SHX5875

## 2017-08-03 LAB — BASIC METABOLIC PANEL
Anion gap: 8 (ref 5–15)
BUN: 27 mg/dL — AB (ref 6–20)
CALCIUM: 9.1 mg/dL (ref 8.9–10.3)
CHLORIDE: 107 mmol/L (ref 101–111)
CO2: 26 mmol/L (ref 22–32)
CREATININE: 1.19 mg/dL — AB (ref 0.44–1.00)
GFR calc Af Amer: 43 mL/min — ABNORMAL LOW (ref >60)
GFR calc non Af Amer: 37 mL/min — ABNORMAL LOW (ref >60)
Glucose, Bld: 130 mg/dL — ABNORMAL HIGH (ref 65–99)
Potassium: 3.8 mmol/L (ref 3.5–5.1)
SODIUM: 141 mmol/L (ref 135–145)

## 2017-08-03 LAB — CBC
HCT: 37.7 % (ref 35.0–47.0)
Hemoglobin: 12.8 g/dL (ref 12.0–16.0)
MCH: 31.8 pg (ref 26.0–34.0)
MCHC: 33.9 g/dL (ref 32.0–36.0)
MCV: 93.9 fL (ref 80.0–100.0)
PLATELETS: 174 10*3/uL (ref 150–440)
RBC: 4.02 MIL/uL (ref 3.80–5.20)
RDW: 14.4 % (ref 11.5–14.5)
WBC: 9.3 10*3/uL (ref 3.6–11.0)

## 2017-08-03 SURGERY — FIXATION, FRACTURE, INTERTROCHANTERIC, WITH INTRAMEDULLARY ROD
Anesthesia: Spinal | Site: Hip | Laterality: Right | Wound class: Clean

## 2017-08-03 MED ORDER — FENTANYL CITRATE (PF) 100 MCG/2ML IJ SOLN
INTRAMUSCULAR | Status: AC
  Filled 2017-08-03: qty 2

## 2017-08-03 MED ORDER — PROPOFOL 500 MG/50ML IV EMUL
INTRAVENOUS | Status: DC | PRN
  Administered 2017-08-03: 25 ug/kg/min via INTRAVENOUS

## 2017-08-03 MED ORDER — MAGNESIUM HYDROXIDE 400 MG/5ML PO SUSP: 30.0000 mL | Freq: Every day | ORAL | Status: DC | PRN

## 2017-08-03 MED ORDER — MIDAZOLAM HCL 5 MG/5ML IJ SOLN
INTRAMUSCULAR | Status: DC | PRN
  Administered 2017-08-03 (×2): 0.5 mg via INTRAVENOUS

## 2017-08-03 MED ORDER — ACETAMINOPHEN 500 MG PO TABS
1000.0000 mg | ORAL_TABLET | Freq: Four times a day (QID) | ORAL | Status: AC
  Administered 2017-08-03 – 2017-08-04 (×2): 1000 mg via ORAL
  Filled 2017-08-03 (×2): qty 2

## 2017-08-03 MED ORDER — ONDANSETRON HCL 4 MG PO TABS: 4.0000 mg | ORAL_TABLET | Freq: Four times a day (QID) | ORAL | Status: DC | PRN

## 2017-08-03 MED ORDER — ACETAMINOPHEN 10 MG/ML IV SOLN
INTRAVENOUS | Status: AC
  Filled 2017-08-03: qty 100

## 2017-08-03 MED ORDER — NORTRIPTYLINE HCL 25 MG PO CAPS
50.0000 mg | ORAL_CAPSULE | Freq: Every day | ORAL | Status: DC
  Administered 2017-08-03 – 2017-08-05 (×3): 50 mg via ORAL
  Filled 2017-08-03 (×4): qty 2

## 2017-08-03 MED ORDER — ENOXAPARIN SODIUM 30 MG/0.3ML ~~LOC~~ SOLN
30.0000 mg | SUBCUTANEOUS | Status: DC
  Administered 2017-08-04 – 2017-08-06 (×3): 30 mg via SUBCUTANEOUS
  Filled 2017-08-03 (×3): qty 0.3

## 2017-08-03 MED ORDER — FLEET ENEMA 7-19 GM/118ML RE ENEM: 1.0000 | ENEMA | Freq: Once | RECTAL | Status: DC | PRN

## 2017-08-03 MED ORDER — METOCLOPRAMIDE HCL 10 MG PO TABS: 5.0000 mg | ORAL_TABLET | Freq: Three times a day (TID) | ORAL | Status: DC | PRN

## 2017-08-03 MED ORDER — KETAMINE HCL 50 MG/ML IJ SOLN
INTRAMUSCULAR | Status: AC
  Filled 2017-08-03: qty 10

## 2017-08-03 MED ORDER — ACETAMINOPHEN 325 MG PO TABS
650.0000 mg | ORAL_TABLET | ORAL | Status: DC | PRN
  Administered 2017-08-05 – 2017-08-06 (×2): 650 mg via ORAL
  Filled 2017-08-03 (×3): qty 2

## 2017-08-03 MED ORDER — DIPHENHYDRAMINE HCL 12.5 MG/5ML PO ELIX: 12.5000 mg | ORAL_SOLUTION | ORAL | Status: DC | PRN

## 2017-08-03 MED ORDER — ONDANSETRON HCL 4 MG/2ML IJ SOLN: 4.0000 mg | Freq: Four times a day (QID) | INTRAMUSCULAR | Status: DC | PRN

## 2017-08-03 MED ORDER — NEOMYCIN-POLYMYXIN B GU 40-200000 IR SOLN
Status: DC | PRN
  Administered 2017-08-03: 2 mL

## 2017-08-03 MED ORDER — PROPOFOL 10 MG/ML IV BOLUS
INTRAVENOUS | Status: AC
Start: 2017-08-03 — End: ?
  Filled 2017-08-03: qty 20

## 2017-08-03 MED ORDER — MORPHINE SULFATE (PF) 2 MG/ML IV SOLN: 2.0000 mg | INTRAVENOUS | Status: DC | PRN

## 2017-08-03 MED ORDER — BUSPIRONE HCL 5 MG PO TABS
7.5000 mg | ORAL_TABLET | Freq: Two times a day (BID) | ORAL | Status: DC
  Filled 2017-08-03 (×2): qty 1.5

## 2017-08-03 MED ORDER — PHENYLEPHRINE HCL 10 MG/ML IJ SOLN
INTRAMUSCULAR | Status: DC | PRN
  Administered 2017-08-03 (×7): 100 ug via INTRAVENOUS

## 2017-08-03 MED ORDER — FENTANYL CITRATE (PF) 100 MCG/2ML IJ SOLN: 25.0000 ug | INTRAMUSCULAR | Status: DC | PRN

## 2017-08-03 MED ORDER — FENTANYL CITRATE (PF) 100 MCG/2ML IJ SOLN
INTRAMUSCULAR | Status: DC | PRN
  Administered 2017-08-03: 50 ug via INTRAVENOUS

## 2017-08-03 MED ORDER — CLINDAMYCIN PHOSPHATE 600 MG/50ML IV SOLN
600.0000 mg | Freq: Four times a day (QID) | INTRAVENOUS | Status: AC
  Administered 2017-08-03 – 2017-08-04 (×3): 600 mg via INTRAVENOUS
  Filled 2017-08-03 (×3): qty 50

## 2017-08-03 MED ORDER — ONDANSETRON HCL 4 MG/2ML IJ SOLN
INTRAMUSCULAR | Status: DC | PRN
  Administered 2017-08-03: 4 mg via INTRAVENOUS

## 2017-08-03 MED ORDER — BISACODYL 10 MG RE SUPP
10.0000 mg | Freq: Every day | RECTAL | Status: DC | PRN
  Administered 2017-08-06: 10 mg via RECTAL
  Filled 2017-08-03: qty 1

## 2017-08-03 MED ORDER — METOCLOPRAMIDE HCL 5 MG/ML IJ SOLN: 5.0000 mg | Freq: Three times a day (TID) | INTRAMUSCULAR | Status: DC | PRN

## 2017-08-03 MED ORDER — NEOMYCIN-POLYMYXIN B GU 40-200000 IR SOLN
Status: AC
Start: 2017-08-03 — End: ?
  Filled 2017-08-03: qty 2

## 2017-08-03 MED ORDER — PROPOFOL 10 MG/ML IV BOLUS
INTRAVENOUS | Status: DC | PRN
Start: 2017-08-03 — End: 2017-08-03
  Administered 2017-08-03: 20 mg via INTRAVENOUS
  Administered 2017-08-03: 30 mg via INTRAVENOUS

## 2017-08-03 MED ORDER — BUPIVACAINE-EPINEPHRINE (PF) 0.5% -1:200000 IJ SOLN
INTRAMUSCULAR | Status: DC | PRN
  Administered 2017-08-03: 30 mL via PERINEURAL

## 2017-08-03 MED ORDER — PANTOPRAZOLE SODIUM 40 MG PO TBEC
40.0000 mg | DELAYED_RELEASE_TABLET | Freq: Every day | ORAL | Status: DC
  Administered 2017-08-04 – 2017-08-06 (×3): 40 mg via ORAL
  Filled 2017-08-03 (×3): qty 1

## 2017-08-03 MED ORDER — DOCUSATE SODIUM 100 MG PO CAPS
100.0000 mg | ORAL_CAPSULE | Freq: Two times a day (BID) | ORAL | Status: DC
  Administered 2017-08-03 – 2017-08-06 (×6): 100 mg via ORAL
  Filled 2017-08-03 (×5): qty 1

## 2017-08-03 MED ORDER — ONDANSETRON HCL 4 MG/2ML IJ SOLN: 4.0000 mg | Freq: Once | INTRAMUSCULAR | Status: DC | PRN

## 2017-08-03 MED ORDER — ACETAMINOPHEN 650 MG RE SUPP: 650.0000 mg | RECTAL | Status: DC | PRN

## 2017-08-03 MED ORDER — BUPIVACAINE-EPINEPHRINE (PF) 0.5% -1:200000 IJ SOLN
INTRAMUSCULAR | Status: AC
  Filled 2017-08-03: qty 30

## 2017-08-03 MED ORDER — MIDAZOLAM HCL 2 MG/2ML IJ SOLN
INTRAMUSCULAR | Status: AC
  Filled 2017-08-03: qty 2

## 2017-08-03 SURGICAL SUPPLY — 40 items
BIT DRILL 4.3MMS DISTAL GRDTED (BIT) ×1 IMPLANT
BNDG COHESIVE 4X5 TAN STRL (GAUZE/BANDAGES/DRESSINGS) ×3 IMPLANT
BNDG COHESIVE 6X5 TAN STRL LF (GAUZE/BANDAGES/DRESSINGS) ×6 IMPLANT
CANISTER SUCT 1200ML W/VALVE (MISCELLANEOUS) ×3 IMPLANT
CHLORAPREP W/TINT 26ML (MISCELLANEOUS) ×6 IMPLANT
DRAPE C-ARMOR (DRAPES) ×3 IMPLANT
DRAPE SHEET LG 3/4 BI-LAMINATE (DRAPES) ×3 IMPLANT
DRILL 4.3MMS DISTAL GRADUATED (BIT) ×3
DRSG OPSITE POSTOP 3X4 (GAUZE/BANDAGES/DRESSINGS) ×12 IMPLANT
DRSG OPSITE POSTOP 4X6 (GAUZE/BANDAGES/DRESSINGS) ×9 IMPLANT
ELECT CAUTERY BLADE 6.4 (BLADE) ×3 IMPLANT
ELECT REM PT RETURN 9FT ADLT (ELECTROSURGICAL) ×3
ELECTRODE REM PT RTRN 9FT ADLT (ELECTROSURGICAL) ×1 IMPLANT
GAUZE SPONGE 4X4 12PLY STRL (GAUZE/BANDAGES/DRESSINGS) ×3 IMPLANT
GLOVE BIO SURGEON STRL SZ8 (GLOVE) ×6 IMPLANT
GLOVE INDICATOR 8.0 STRL GRN (GLOVE) ×3 IMPLANT
GOWN STRL REUS W/ TWL LRG LVL3 (GOWN DISPOSABLE) ×1 IMPLANT
GOWN STRL REUS W/ TWL XL LVL3 (GOWN DISPOSABLE) ×1 IMPLANT
GOWN STRL REUS W/TWL LRG LVL3 (GOWN DISPOSABLE) ×2
GOWN STRL REUS W/TWL XL LVL3 (GOWN DISPOSABLE) ×2
GUIDEPIN VERSANAIL DSP 3.2X444 (ORTHOPEDIC DISPOSABLE SUPPLIES) ×3 IMPLANT
GUIDEWIRE BALL NOSE 100CM (WIRE) ×3 IMPLANT
HFN RH 130 DEG 11MM X 360MM (Orthopedic Implant) ×3 IMPLANT
MAT BLUE FLOOR 46X72 FLO (MISCELLANEOUS) ×3 IMPLANT
NEEDLE FILTER BLUNT 18X 1/2SAF (NEEDLE) ×2
NEEDLE FILTER BLUNT 18X1 1/2 (NEEDLE) ×1 IMPLANT
NEEDLE HYPO 22GX1.5 SAFETY (NEEDLE) ×6 IMPLANT
NS IRRIG 500ML POUR BTL (IV SOLUTION) ×3 IMPLANT
PACK HIP COMPR (MISCELLANEOUS) ×3 IMPLANT
SCREW BONE CORTICAL 5.0X38 (Screw) ×3 IMPLANT
SCREW BONE CORTICAL 5.0X44 (Screw) ×3 IMPLANT
SCREW LAG HIP NAIL 10.5X95 (Screw) ×3 IMPLANT
STAPLER SKIN PROX 35W (STAPLE) ×3 IMPLANT
STRAP SAFETY BODY (MISCELLANEOUS) ×3 IMPLANT
SUT VIC AB 0 CT1 36 (SUTURE) ×3 IMPLANT
SUT VIC AB 1 CT1 36 (SUTURE) ×3 IMPLANT
SUT VIC AB 2-0 CT1 (SUTURE) ×6 IMPLANT
SYR 30ML LL (SYRINGE) ×6 IMPLANT
SYRINGE 10CC LL (SYRINGE) ×3 IMPLANT
TAPE MICROFOAM 4IN (TAPE) ×3 IMPLANT

## 2017-08-03 NOTE — Clinical Social Work Note (Addendum)
Clinical Social Work Assessment  Patient Details  Name: Amber Reese MRN: [id number] Date of Birth: [birth year]  Date of referral:  08/03/17               Reason for consult:  Facility Placement, Discharge Planning                Permission sought to share information with:  Chartered certified accountant granted to share information::  Yes, Verbal Permission Granted  Name::      [name]::   [place]  Relationship::     Contact Information:     Housing/Transportation Living arrangements for the past 2 months:  [place] of Information:  Patient, Adult Children, Facility Patient Interpreter Needed:  None Criminal Activity/Legal Involvement Pertinent to Current Situation/Hospitalization:  No - Comment as needed Significant Relationships:  Adult Children Lives with:  Facility Resident Do you feel safe going back to the place where you live?  Yes Need for family participation in patient care:  Yes (Comment)  Care giving concerns:  Patient has been a resident at [facility] for 2 months.    Social Worker assessment / plan: Holiday representative (CSW) received SNF consult. CSW reviewed patient's chart and noted that patient is from [facility]. Social work Theatre manager met with patient and her pastor Claiborne Billings at bedside prior to her surgery today. Patient was alert and oriented x4. Social work Theatre manager introduced herself and explained the role of the [place]. Patient stated that she is a resident from [facility] and relocated there from [facility] independent living about 2 months ago. Patient stated that her son Amber Reese [phone]) is her HPOA. Social work Theatre manager explained the SNF process and that the patient may need to attend rehab after surgery. Patient is accepting of completing rehab at [facility] facility. Social work Theatre manager explained that her insurance, Medicare, requires a 3 night qualfying inpatient  hospital stay in order to pay for SNF. Patient verbalized her understanding. Patient was admitted on 08/02/17. CSW was able to get in contact with patient's son Amber Reese and made him aware of above. Son is in agreement with D/C plan. FL2 completed and faxed out. [facility] admissions coordinator at [facility] confirmed that patient is a [facility] resident and can come to [facility] after hospital stay. CSW will continue to follow and assist as needed.   Employment status:  Retired Forensic scientist:  Medicare PT Recommendations:  [place] / Referral to community resources:  [place]  Patient/Family's Response to care: Patient is accepting of completing her rehab at [facility].   Patient/Family's Understanding of and Emotional Response to Diagnosis, Current Treatment, and Prognosis:  Patient and son Amber Reese were pleasant and thanked CSW and social work Theatre manager for her assistance.  Emotional Assessment Appearance:  Appears stated age Attitude/Demeanor/Rapport:    Affect (typically observed):  Accepting, Calm, Pleasant Orientation:  Oriented to Self, Oriented to Place, Oriented to  Time, Oriented to Situation Alcohol / Substance use:  Not Applicable Psych involvement (Current and /or in the community):  No (Comment)  Discharge Needs  Concerns to be addressed:  Discharge Planning Concerns, Care Coordination Readmission within the last 30 days:  No Current discharge risk:  Dependent with Mobility Barriers to Discharge:  Continued Medical Work up   Smith Mince, Student-Social Work 08/03/2017, 9:38 AM

## 2017-08-03 NOTE — Anesthesia Post-op Follow-up Note (Signed)
Anesthesia QCDR form completed.        

## 2017-08-03 NOTE — NC FL2 (Signed)
[place] LEVEL OF CARE SCREENING TOOL     IDENTIFICATION  Patient Name: Amber Reese Birthdate: Apr 02, 1918 Sex: female Admission Date (Current Location): 08/02/2017  [place] and [place] Number:  Engineering geologist and Address:  [facility], [address] [place],  [address]      Provider Number: [id number]  Attending Physician Name and Address:  Gladstone Lighter, MD  Relative Name and Phone Number:       Current Level of Care: Hospital Recommended Level of Care: [place] Prior Approval Number:    Date Approved/Denied:   PASRR Number:  [id number])  Discharge Plan: SNF    Current Diagnoses: Patient Active Problem List   Diagnosis Date Noted  . Hip fracture ([place]) 08/02/2017    Orientation RESPIRATION BLADDER Height & Weight     Self, Time, Situation, Place  Normal Continent Weight: 152 lb (68.9 kg) Height:  4\' 9"  (144.8 cm)  BEHAVIORAL SYMPTOMS/MOOD NEUROLOGICAL BOWEL NUTRITION STATUS      Continent Diet (NPO for surgery to be advanced. )  AMBULATORY STATUS COMMUNICATION OF NEEDS Skin   Extensive Assist Verbally Surgical wounds                       Personal Care Assistance Level of Assistance  Bathing, Feeding, Dressing Bathing Assistance: Limited assistance Feeding assistance: Independent Dressing Assistance: Limited assistance     Functional Limitations Info  Sight, Hearing, Speech Sight Info: Impaired Hearing Info: Adequate Speech Info: Adequate    SPECIAL CARE FACTORS FREQUENCY  PT (By licensed PT), OT (By licensed OT)     PT Frequency:  (5) OT Frequency:  (5)            Contractures      Additional Factors Info  Code Status, Allergies Code Status Info:  (DNR ) Allergies Info:  (Amoxicillin)           Current Medications (08/03/2017):  This is the current hospital active medication list Current Facility-Administered Medications  Medication Dose Route  Frequency Provider Last Rate Last Dose  . 0.9 %  sodium chloride infusion   Intravenous Continuous Gladstone Lighter, MD 75 mL/hr at 08/02/17 2231    . acetaminophen (TYLENOL) tablet 500 mg  500 mg Oral Q6H PRN Gladstone Lighter, MD      . alum & mag hydroxide-simeth (MAALOX/MYLANTA) 200-200-20 MG/5ML suspension 30 mL  30 mL Oral Q4H PRN Gladstone Lighter, MD      . cholecalciferol (VITAMIN D) tablet 2,000 Units  2,000 Units Oral Daily Gladstone Lighter, MD      . clindamycin (CLEOCIN) IVPB 600 mg  600 mg Intravenous Once Poggi, Marshall Cork, MD      . Difluprednate 0.05 % EMUL 1 drop  1 drop Right Eye QID Gladstone Lighter, MD   1 drop at 08/02/17 2259  . docusate sodium (COLACE) capsule 100 mg  100 mg Oral BID Gladstone Lighter, MD   100 mg at 08/02/17 2259  . latanoprost (XALATAN) 0.005 % ophthalmic solution 1 drop  1 drop Left Eye QHS Gladstone Lighter, MD   1 drop at 08/02/17 2302  . levothyroxine (SYNTHROID, LEVOTHROID) tablet 50 mcg  50 mcg Oral Once per day on Sun Mon Wed Thu Fri Kalisetti, Radhika, MD      . Derrill Memo ON 08/05/2017] levothyroxine (SYNTHROID, LEVOTHROID) tablet 75 mcg  75 mcg Oral Once per day on Tue Sat Gladstone Lighter, MD      . morphine 2 MG/ML  injection 2 mg  2 mg Intravenous Q1H PRN Rudene Re, MD   2 mg at 08/03/17 0641  . nystatin (MYCOSTATIN/NYSTOP) topical powder   Topical BID Gladstone Lighter, MD      . ondansetron [facility]) tablet 4 mg  4 mg Oral Q6H PRN Gladstone Lighter, MD       Or  . ondansetron (ZOFRAN) injection 4 mg  4 mg Intravenous Q6H PRN Gladstone Lighter, MD      . polyethylene glycol (MIRALAX / GLYCOLAX) packet 17 g  17 g Oral Daily Gladstone Lighter, MD      . sodium chloride (MURO 128) 5 % ophthalmic solution 1 drop  1 drop Left Eye BID Gladstone Lighter, MD   1 drop at 08/02/17 2302  . timolol (TIMOPTIC) 0.5 % ophthalmic solution 1 drop  1 drop Left Eye BID Gladstone Lighter, MD   1 drop at 08/02/17 2300     Discharge  Medications: Please see discharge summary for a list of discharge medications.  Relevant Imaging Results:  Relevant Lab Results:   Additional Information  (SSN: [id number])  Bridgett Hattabaugh, Veronia Beets, LCSW

## 2017-08-03 NOTE — Progress Notes (Signed)
[facility] NAME: Amber Reese    MR#:  [id number]  DATE OF BIRTH:  1918/06/17  SUBJECTIVE:  CHIEF COMPLAINT:   Chief Complaint  Patient presents with  . Fall  . Hip Pain   - patient admitted with fall and right hip fracture. Going for surgery today. -Slightly confused after the morphine injection. Family at bedside and updated.  REVIEW OF SYSTEMS:  Review of Systems  Constitutional: Negative for chills, fever and malaise/fatigue.  HENT: Negative for congestion, ear discharge, hearing loss and nosebleeds.   Eyes: Negative for blurred vision and double vision.  Respiratory: Negative for cough, shortness of breath and wheezing.   Cardiovascular: Negative for chest pain, palpitations and leg swelling.  Gastrointestinal: Negative for abdominal pain, constipation, diarrhea, nausea and vomiting.  Genitourinary: Negative for dysuria.  Musculoskeletal: Positive for joint pain and myalgias.  Neurological: Negative for dizziness, seizures and headaches.  Psychiatric/Behavioral:       Confusion    DRUG ALLERGIES:   Allergies  Allergen Reactions  . Amoxicillin     VITALS:  Blood pressure (!) 147/61, pulse 100, temperature 98 F (36.7 C), temperature source Oral, resp. rate 18, height 4\' 9"  (1.448 m), weight 68.9 kg (152 lb), SpO2 95 %.  PHYSICAL EXAMINATION:  Physical Exam  GENERAL:  83 y.o.-year-old patient lying in the bed with no acute distress.  EYES: left Pupil round, reactive to light and accommodation. Right eye opacified cornea. No scleral icterus. Extraocular muscles intact.  HEENT: Head atraumatic, normocephalic. Oropharynx and nasopharynx clear.  NECK:  Supple, no jugular venous distention. No thyroid enlargement, no tenderness.  LUNGS: Normal breath sounds bilaterally, no wheezing, rales,rhonchi or crepitation. No use of accessory muscles of respiration.  CARDIOVASCULAR: S1, S2 normal. No  rubs, or gallops. 3/6  systolic murmur is present ABDOMEN: Soft, nontender, nondistended. Bowel sounds present. No organomegaly or mass.  EXTREMITIES: No pedal edema, cyanosis, or clubbing. Right hip pain, right leg is shortened and abducted NEUROLOGIC: Cranial nerves II through XII are intact. Muscle strength 5/5 in all extremities except limited rotation of right hip due to pain. Sensation intact. Gait not checked.  PSYCHIATRIC: The patient is alert and oriented x 3.  SKIN: No obvious rash, lesion, or ulcer.    LABORATORY PANEL:   CBC  Recent Labs Lab 08/03/17 0436  WBC 9.3  HGB 12.8  HCT 37.7  PLT 174   ------------------------------------------------------------------------------------------------------------------  Chemistries   Recent Labs Lab 08/03/17 0436  NA 141  K 3.8  CL 107  CO2 26  GLUCOSE 130*  BUN 27*  CREATININE 1.19*  CALCIUM 9.1   ------------------------------------------------------------------------------------------------------------------  Cardiac Enzymes No results for input(s): TROPONINI in the last 168 hours. ------------------------------------------------------------------------------------------------------------------  RADIOLOGY:  Dg Chest 1 View  Result Date: 08/02/2017 CLINICAL DATA:  Hip fracture EXAM: CHEST 1 VIEW COMPARISON:  01/12/2016 FINDINGS: Mild cardiomegaly with aortic atherosclerosis. Mild diffuse interstitial opacity likely chronic change. No consolidation or effusion. No pneumothorax. IMPRESSION: Mild cardiomegaly.  Negative for edema or infiltrate. Electronically Signed   By: Donavan Foil M.D.   On: 08/02/2017 19:18   Ct Head Wo Contrast  Result Date: 08/02/2017 CLINICAL DATA:  Trauma to the right side of head after trip and fall today. EXAM: CT HEAD WITHOUT CONTRAST TECHNIQUE: Contiguous axial images were obtained from the base of the skull through the vertex without intravenous contrast. COMPARISON:  None. FINDINGS: BRAIN: There is sulcal  and ventricular prominence consistent with superficial and  central atrophy. No intraparenchymal hemorrhage, mass effect nor midline shift. 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 Periventricular and subcortical white matter hypodensities consistent with chronic small vessel ischemic disease are identified. No acute large vascular territory infarcts. No abnormal extra-axial fluid collections. Basal cisterns are not effaced and midline. VASCULAR: Marked calcific atherosclerosis of the carotid siphons. No hyperdense vessels. SKULL: No skull fracture. No significant scalp soft tissue swelling. SINUSES/ORBITS: The mastoid air-cells are clear. The included paranasal sinuses are well-aerated.The included ocular globes and orbital contents are non-suspicious. Right cataract extraction and glaucoma reservoir is again seen. OTHER: None. IMPRESSION: 1. Atrophy with chronic small vessel ischemic disease. Remote infarct of the right lenticulostriate. No acute intracranial abnormality. 2. Re- demonstration of extensor atherosclerotic calcifications at the skullbase. Electronically Signed   By: Ashley Royalty M.D.   On: 08/02/2017 19:40   Dg Hip Unilat  With Pelvis 2-3 Views Right  Result Date: 08/02/2017 CLINICAL DATA:  Fall with shortening of the like EXAM: DG HIP (WITH OR WITHOUT PELVIS) 2-3V RIGHT COMPARISON:  None. FINDINGS: Pubic symphysis is intact.  No fracture or dislocation on the left. Acute, comminuted and displaced fracture involving the base of the femoral neck and the trochanter. Laterally displaced greater trochanteric fracture fragment. IMPRESSION: Acute, comminuted and displaced right proximal femoral fracture involving the base of the femoral neck and the trochanter. Electronically Signed   By: Donavan Foil M.D.   On: 08/02/2017 19:17    EKG:   Orders placed or performed during the hospital encounter of 08/02/17  . ED EKG  . ED EKG    ASSESSMENT AND PLAN:     Amber Reese  is a 83 y.o. female with a known history of hypertension, hyperlipidemia, arthritis, CKD, glaucoma presents to hospital secondary to a fall and right hip pain.  #1 right femoral neck fracture-secondary to mechanical fall. -Patient does not have any known cardiac disease, no chest pain on exertion or no dyspnea. -Other than her age, no other limiting factors for surgery. -Considering risks and benefits, can proceed with surgery.No other testing needed. -Monitor hemoglobin after surgery. Physical therapy will be started after surgery -Orthopedics consulted. Pain control at this time. -Likely will need rehabilitation at discharge.  #2 hypothyroidism-continue Synthroid  #3 glaucoma-continue eyedrops  #4 DVT prophylaxis-will be started after surgery  Social worker consulted.      All the records are reviewed and case discussed with Care Management/Social Workerr. Management plans discussed with the patient, family and they are in agreement.  CODE STATUS: DNR  TOTAL TIME TAKING CARE OF THIS PATIENT: 37 minutes.   POSSIBLE D/C IN 2-3 DAYS, DEPENDING ON CLINICAL CONDITION.   Amber Reese M.D on 08/03/2017 at 1:51 PM  Between 7am to 6pm - Pager - [phone]  After 6pm go to [email or web address] - password [email or web address] [facility]  Office  [phone]  CC: Primary care physician; Kirk Ruths, MD

## 2017-08-03 NOTE — Anesthesia Preprocedure Evaluation (Addendum)
Anesthesia Evaluation  Patient identified by MRN, date of birth, ID band Patient awake    Reviewed: Allergy & Precautions, NPO status , Patient's Chart, lab work & pertinent test results, reviewed documented beta blocker date and time   Airway Mallampati: III  TM Distance: >3 FB     Dental  (+) Chipped   Pulmonary           Cardiovascular hypertension, Pt. on medications      Neuro/Psych PSYCHIATRIC DISORDERS Depression    GI/Hepatic   Endo/Other    Renal/GU Renal disease     Musculoskeletal  (+) Arthritis ,   Abdominal   Peds  Hematology   Anesthesia Other Findings CXR ok. Corneal transplant. Obese. WAs given morphine few hrs ago, still very sleepy. Uses a walker. DNR family understands.  Reproductive/Obstetrics                            Anesthesia Physical Anesthesia Plan  ASA: III  Anesthesia Plan: Spinal   Post-op Pain Management:    Induction:   PONV Risk Score and Plan:   Airway Management Planned:   Additional Equipment:   Intra-op Plan:   Post-operative Plan:   Informed Consent: I have reviewed the patients History and Physical, chart, labs and discussed the procedure including the risks, benefits and alternatives for the proposed anesthesia with the patient or authorized representative who has indicated his/her understanding and acceptance.     Plan Discussed with: CRNA  Anesthesia Plan Comments:         Anesthesia Quick Evaluation

## 2017-08-03 NOTE — Transfer of Care (Signed)
Immediate Anesthesia Transfer of Care Note  Patient: Amber Reese  Procedure(s) Performed: INTRAMEDULLARY (IM) NAIL INTERTROCHANTRIC (Right Hip)  Patient Location: PACU  Anesthesia Type:Spinal  Level of Consciousness: awake  Airway & Oxygen Therapy: Patient Spontanous Breathing and Patient connected to face mask oxygen  Post-op Assessment: Report given to RN and Post -op Vital signs reviewed and stable  Post vital signs: Reviewed and stable  Last Vitals:  Vitals:   08/03/17 0739 08/03/17 1528  BP: (!) 147/61 (!) 173/98  Pulse: 100 (!) 110  Resp: 18 16  Temp: 36.7 C (!) 36.3 C  SpO2: 95% 91%    Last Pain:  Vitals:   08/03/17 1528  TempSrc: Tympanic  PainSc:          Complications: No apparent anesthesia complications

## 2017-08-03 NOTE — Anesthesia Procedure Notes (Signed)
Date/Time: 08/03/2017 4:16 PM Performed by: Johnna Acosta Pre-anesthesia Checklist: Patient identified, Emergency Drugs available, Suction available, Patient being monitored and Timeout performed Patient Re-evaluated:Patient Re-evaluated prior to induction Oxygen Delivery Method: Simple face mask Preoxygenation: Pre-oxygenation with 100% oxygen

## 2017-08-03 NOTE — Consult Note (Signed)
ORTHOPAEDIC CONSULTATION  REQUESTING PHYSICIAN: Gladstone Lighter, MD  Chief Complaint:   Right hip pain.  History of Present Illness: Amber Reese is a 83 y.o. female walker dependent resident of an assisted living facility with several medical issues, including hypertension, depression, chronic disease, glaucoma, and hypothyroidism. She was in her usual state of health history afternoon and was out for a walk with her son when her walker apparently caught on a crack in the sidewalk, causing her to trip and fall onto her right side. She was unable to get up. She was brought to the emergency room where x-rays demonstrated a displaced intertrochanteric/subtrochanteric fracture. The patient denies any associated injuries. She did not strike her head or lose consciousness. She also denies any lightheadedness, dizziness, chest pain, shortness breath, or other symptoms that may have precipitated her fall. She has been admitted to the medicine service in preparation for definitive management of this injury.  Past Medical History:  Diagnosis Date  . Chronic kidney disease   . Depression   . Glaucoma   . Hyperlipidemia   . Hypertension   . Left ventricular failure ([place])   . Osteoarthritis   . Polyosteoarthritis   . Thyroid disease    Past Surgical History:  Procedure Laterality Date  . CORNEAL TRANSPLANT     Social History   Social History  . Marital status: Widowed    Spouse name: N/A  . Number of children: N/A  . Years of education: N/A   Social History Main Topics  . Smoking status: Never Smoker  . Smokeless tobacco: Never Used  . Alcohol use No  . Drug use: No  . Sexual activity: Not Asked   Other Topics Concern  . None   Social History Narrative  . None   History reviewed. No pertinent family history. Allergies  Allergen Reactions  . Amoxicillin    Prior to Admission medications   Medication Sig Start  Date End Date Taking? Authorizing Provider  acetaminophen (TYLENOL) 500 MG tablet Take 500 mg by mouth every 6 (six) hours as needed for mild pain or moderate pain.    Yes [provider]  alum & mag hydroxide-simeth (MAALOX/MYLANTA) 200-200-20 MG/5ML suspension Take 30 mLs by mouth every 4 (four) hours as needed for indigestion or flatulence.   Yes [provider]  aspirin EC 81 MG tablet Take 1 tablet (81 mg total) by mouth daily. 12/26/14  Yes Baity, Coralie Keens, NP  bismuth subsalicylate (PEPTO BISMOL) 262 MG/15ML suspension Take 10 mLs by mouth 3 (three) times daily as needed for diarrhea or loose stools.   Yes [provider]  busPIRone (BUSPAR) 7.5 MG tablet Take 7.5 mg by mouth 2 (two) times daily.    Yes [provider]  carbamide peroxide (DEBROX) 6.5 % OTIC solution Place 5 drops into both ears daily as needed. Instill 5 drops into affected ears daily for 5 days then irrigate/remove cerumen due to impaction as needed   Yes [provider]  carboxymethylcellulose (REFRESH PLUS) 0.5 % SOLN 1 drop 4 (four) times daily as needed.   Yes [provider]  dextromethorphan-guaiFENesin (ROBITUSSIN-DM) 10-100 MG/5ML liquid Take 10 mLs by mouth every 4 (four) hours as needed for cough.   Yes [provider]  Difluprednate 0.05 % EMUL Place 1 drop into the right eye 4 (four) times daily.   Yes [provider]  diphenhydrAMINE (BENADRYL) 25 MG tablet Take 25 mg by mouth every 4 (four) hours as needed for itching or  allergies.   Yes [provider]  Ergocalciferol (VITAMIN D2) 2000 units TABS Take 2,000 Units by mouth daily.   Yes [provider]  Eyelid Cleansers (OCUSOFT EYELID CLEANSING EX) Apply 2 application topically. Use 2 applications every Monday, Wednesday and Friday. Use 1 pad per eye.   Yes [provider]  latanoprost (XALATAN) 0.005 % ophthalmic solution Place 1 drop into the left eye at bedtime.    Yes [provider]  levothyroxine (SYNTHROID, LEVOTHROID) 50 MCG tablet Take 1 tablet (50 mcg total) by mouth daily. Patient taking differently: Take 50-75 mcg by mouth daily. Pt takes 78mcg on Sundays, Mondays, Wednesdays, Thursdays, and Fridays. Pt takes 62mcg on Tuesdays and saturdays. 12/26/14  Yes Baity, Coralie Keens, NP  magnesium hydroxide (MILK OF MAGNESIA) 400 MG/5ML suspension Take 30 mLs by mouth daily as needed for mild constipation.   Yes [provider]  nortriptyline (PAMELOR) 50 MG capsule Take 1 capsule (50 mg total) by mouth 2 (two) times daily. Patient taking differently: Take 50 mg by mouth at bedtime.  12/26/14  Yes Jearld Fenton, NP  nystatin (MYCOSTATIN/NYSTOP) powder Apply topically 2 (two) times daily. Apply twice daily under breasts and to groin/abdominal fold areas as needed for rash.   Yes [provider]  polyethylene glycol (MIRALAX / GLYCOLAX) packet Take 17 g by mouth daily.    Yes [provider]  sodium chloride (MURO 128) 5 % ophthalmic solution Place 1 drop into the left eye 2 (two) times daily. 30 minutes before or after prescription drops   Yes [provider]  timolol (BETIMOL) 0.5 % ophthalmic solution Place 1 drop into the left eye 2 (two) times daily.   Yes [provider]  cephALEXin (KEFLEX) 500 MG capsule Take 1 capsule (500 mg total) by mouth 3 (three) times daily. Patient not taking: Reported on 03/18/2016 01/12/16   Nance Pear, MD  tolterodine (DETROL LA) 4 MG 24 hr capsule Take 1 capsule (4 mg total) by mouth daily. Patient not taking: Reported on 03/23/2017 12/26/14   Jearld Fenton, NP   Dg Chest 1 View  Result Date: 08/02/2017 CLINICAL DATA:  Hip fracture EXAM: CHEST 1 VIEW COMPARISON:  01/12/2016 FINDINGS: Mild cardiomegaly with aortic atherosclerosis. Mild diffuse interstitial opacity likely chronic change. No consolidation or effusion. No pneumothorax. IMPRESSION: Mild cardiomegaly.  Negative  for edema or infiltrate. Electronically Signed   By: Donavan Foil M.D.   On: 08/02/2017 19:18   Ct Head Wo Contrast  Result Date: 08/02/2017 CLINICAL DATA:  Trauma to the right side of head after trip and fall today. EXAM: CT HEAD WITHOUT CONTRAST TECHNIQUE: Contiguous axial images were obtained from the base of the skull through the vertex without intravenous contrast. COMPARISON:  None. FINDINGS: BRAIN: There is sulcal and ventricular prominence consistent with superficial and central atrophy. No intraparenchymal hemorrhage, mass effect nor midline shift. 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 Periventricular and subcortical white matter hypodensities consistent with chronic small vessel ischemic disease are identified. No acute large vascular territory infarcts. No abnormal extra-axial fluid collections. Basal cisterns are not effaced and midline. VASCULAR: Marked calcific atherosclerosis of the carotid siphons. No hyperdense vessels. SKULL: No skull fracture. No significant scalp soft tissue swelling. SINUSES/ORBITS: The mastoid air-cells are clear. The included paranasal sinuses are well-aerated.The included ocular globes and orbital contents are non-suspicious. Right cataract extraction and glaucoma reservoir is again seen. OTHER: None. IMPRESSION: 1. Atrophy with chronic small vessel  ischemic disease. Remote infarct of the right lenticulostriate. No acute intracranial abnormality. 2. Re- demonstration of extensor atherosclerotic calcifications at the skullbase. Electronically Signed   By: Ashley Royalty M.D.   On: 08/02/2017 19:40   Dg Hip Unilat  With Pelvis 2-3 Views Right  Result Date: 08/02/2017 CLINICAL DATA:  Fall with shortening of the like EXAM: DG HIP (WITH OR WITHOUT PELVIS) 2-3V RIGHT COMPARISON:  None. FINDINGS: Pubic symphysis is intact.  No fracture or dislocation on the left. Acute, comminuted and displaced fracture involving the base of the  femoral neck and the trochanter. Laterally displaced greater trochanteric fracture fragment. IMPRESSION: Acute, comminuted and displaced right proximal femoral fracture involving the base of the femoral neck and the trochanter. Electronically Signed   By: Donavan Foil M.D.   On: 08/02/2017 19:17    Positive ROS: All other systems have been reviewed and were otherwise negative with the exception of those mentioned in the HPI and as above.  Physical Exam: General:  Alert, no acute distress Psychiatric:  Patient is competent for consent with normal mood and affect   Cardiovascular:  No pedal edema Respiratory:  No wheezing, non-labored breathing GI:  Abdomen is soft and non-tender Skin:  No lesions in the area of chief complaint Neurologic:  Sensation intact distally Lymphatic:  No axillary or cervical lymphadenopathy  Orthopedic Exam:  Orthopedic examination is limited to the right hip and lower extremity. The right lower extremity is held in a shortened and externally rotated position as compared to the left. Skin inspection around the right hip is unremarkable, other than for some swelling. There is no evidence for erythema, ecchymosis, abrasions, or other skin abnormalities. She has tenderness to palpation over the lateral aspect of the hip. She has more severe pain with any attempted active or passive motion of the hip. She is able to actively dorsiflex and plantar flex her toes and ankle. Sensation is intact to light touch to all digits. His right lower extremity and foot. She has good capillary refill to her right foot.  X-rays:  X-rays of the pelvis and right hip are available for review. These films demonstrate a displaced transverse subtrochanteric fracture with intra-articular extension. The hip joint itself appears to be well-maintained. No significant degenerative changes are noted.  Assessment: Displaced intertrochanteric/subtrochanteric right hip fracture.  Plan: The treatment  options were discussed with the patient, including both surgical and nonsurgical choices. The patient would like to proceed with surgical intervention to include a reduction and internal fixation of her right hip fracture using a intramedullary nail and screws. This procedure has been discussed in detail with the patient, as have the potential risks (including bleeding, infection, nerve and blood vessel injury, persistent or recurrent pain, stiffness, malunion or nonunion, leg length inequality, need for further surgery, blood clots, strokes, heart attacks or arrhythmias, etc.) and benefits. The patient states redness and wished to proceed. A formal written consent will be obtained by the nursing staff.  Thank you for ask me to participate in the care of this most pleasant unfortunate woman. I will be happy to follow her with you.   Pascal Lux, MD  Beeper #:  [phone]  08/03/2017 9:55 AM

## 2017-08-03 NOTE — Op Note (Signed)
08/02/2017 - 08/03/2017  6:17 PM  Patient:   Amber Reese  Pre-Op Diagnosis:   Displaced three-part intertrochanteric/subtrochanteric right hip fracture.  Post-Op Diagnosis:   Same  Procedure:   Reduction and internal fixation of displaced three-part intertrochanteric/subtrochanteric right hip fracture with Biomet Affixis TFN nail.  Surgeon:   Pascal Lux, MD  Assistant:   None  Anesthesia:   Spinal  Findings:   As above  Complications:   None  EBL:   150 cc  Fluids:   600 cc crystalloid  UOP:   150 cc  TT:   None  Drains:   None  Closure:   Staples  Implants:   Biomet Affixis 11 x 360 mm TFN with a 95 mm lag screw and 38 mm and 44 mm mm distal interlocking screws  Brief Clinical Note:   The patient is a 83 year old female who sustained the above-noted injury late yesterday afternoon when she tripped while walking on a sidewalk with her son. She was brought to the emergency room where x-rays demonstrated the above-noted injury. The patient has been cleared medically and presents at this time for reduction and internal fixation of the displaced intertrochanteric/subtrochanteric right hip fracture.  Procedure:   The patient was brought into the operating room. After adequate spinal anesthesia was obtained, the patient was lain in the supine position on the fracture table. The uninjured leg was placed in a flexed and abducted position while the injured lower extremity was placed in longitudinal traction. The fracture was reduced using longitudinal traction and internal rotation. The adequacy of reduction was verified fluoroscopically in AP and lateral projections and found to be near anatomic. The lateral aspects of the right hip and thigh were prepped with ChloraPrep solution before being draped sterilely. Preoperative antibiotics were administered. A timeout was performed to verify the appropriate surgical site. The greater trochanter was identified fluoroscopically and an  approximately 3 cm incision made about 2-3 fingerbreadths above the tip of the greater trochanter. The incision was carried down through the subcutaneous tissues to expose the gluteal fascia. This was split the length of the incision, providing access to the tip of the trochanter. Under fluoroscopic guidance, a guidewire was drilled through the tip of the trochanter into the proximal metaphysis to the level of the lesser trochanter. After verifying its position fluoroscopically in AP and lateral projections, it was overreamed with the initial reamer to the depth of the lesser trochanter. A guidewire was passed down through the femoral canal to the supracondylar region. The adequacy of guidewire position was verified fluoroscopically in AP and lateral projections before the length of the guidewire within the canal was measured and found to be 370 mm. Therefore, a 360 mm length nail was selected. The guidewire was overreamed sequentially using the flexible reamers, beginning with a 10.5 mm reamer and progressing to a 12.5 mm reamer. This provided good cortical chatter. The 11 x 360 mm Biomet Affixis TFN rod was selected and advanced to the appropriate depth, as verified fluoroscopically.   The guide system for the lag screw was positioned and advanced through an approximately 2 cm stab incision over the lateral aspect of the proximal femur. The guidewire was drilled up through the trochanteric femoral nail and into the femoral neck to rest within 5 mm of subchondral bone. After verifying its position in the femoral neck and head in both AP and lateral projections, the guidewire was measured and found to be optimally replicated by a 95 mm lag screw.  The guidewire was overreamed to the appropriate depth before the lag screw was inserted and advanced to the appropriate depth as verified fluoroscopically in AP and lateral projections. The locking screw was advanced, then backed off a quarter turn to set the lag screw.  Traction was let off the leg and the compression device applied. Under fluoroscopic visualization, a gentle compression was applied across the fracture construct. Again the adequacy of hardware position and fracture reduction was verified fluoroscopically in AP and lateral projections and found to be excellent.  Attention was directed distally. Using the "perfect circle" technique, the leg and fluoroscopy machine were positioned appropriately. An approximate 1.5 cm stab incision was made over the skin at the appropriate point before the drill bit was advanced through the cortex and across the static hole of the nail. The appropriate length of the screw was determined before the 38 mm distal interlocking screw was positioned, then advanced and tightened securely. Given that this had a subtrochanteric component, it was elected to place a second distal interlocking screw. Therefore, again using the "perfect circle" technique, the leg and fluoroscopy machine were positioned appropriately. An approximate 1.5 cm stab incision was made over the skin at the appropriate point before the drill bit was advanced through the cortex and across the dynamic hole of the nail. The appropriate length of the screw was determined before the 44 mm distal interlocking screw was positioned, then advanced and tightened securely. Again the adequacy of screw positions were verified fluoroscopically in AP and lateral projections and found to be excellent.  The wounds were irrigated thoroughly with sterile saline solution before the abductor fascia was reapproximated using #1 Vicryl interrupted sutures. The subcutaneous tissues were closed using 2-0 Vicryl interrupted sutures. The skin was closed using staples. A total of 30 cc of 0.5% Sensorcaine with epinephrine was injected in and around all incisions. Sterile occlusive dressings were applied to all wounds before the patient was transferred back to her hospital bed. The patient was then  returned to the recovery room in satisfactory condition after tolerating the procedure well.

## 2017-08-03 NOTE — Clinical Social Work Placement (Signed)
   CLINICAL SOCIAL WORK PLACEMENT  NOTE  Date:  08/03/2017  Patient Details  Name: Amber Reese MRN: [id number] Date of Birth: [birth year]  Clinical Social Work is seeking post-discharge placement for this patient at the [place] level of care (*CSW will initial, date and re-position this form in  chart as items are completed):  Yes   Patient/family provided with [place] Work Department's list of facilities offering this level of care within the geographic area requested by the patient (or if unable, by the patient's family).  Yes   Patient/family informed of their freedom to choose among providers that offer the needed level of care, that participate in Medicare, Medicaid or managed care program needed by the patient, have an available bed and are willing to accept the patient.  Yes   Patient/family informed of [facility]'s ownership interest in [facility] and [facility], as well as of the fact that they are under no obligation to receive care at these facilities.  PASRR submitted to EDS on 08/03/17     PASRR number received on 08/03/17     Existing PASRR number confirmed on       FL2 transmitted to all facilities in geographic area requested by pt/family on 08/03/17     FL2 transmitted to all facilities within larger geographic area on       Patient informed that his/her managed care company has contracts with or will negotiate with certain facilities, including the following:        Yes   Patient/family informed of bed offers received.  Patient chooses bed at  [facility] )     Physician recommends and patient chooses bed at      Patient to be transferred to   on  .  Patient to be transferred to facility by       Patient family notified on   of transfer.  Name of family member notified:        PHYSICIAN       Additional Comment:    _______________________________________________ Jacy Brocker, Veronia Beets,  LCSW 08/03/2017, 9:12 AM

## 2017-08-04 ENCOUNTER — Encounter: Payer: Self-pay | Admitting: Surgery

## 2017-08-04 LAB — CBC
HEMATOCRIT: 32.9 % — AB (ref 35.0–47.0)
Hemoglobin: 10.9 g/dL — ABNORMAL LOW (ref 12.0–16.0)
MCH: 31.4 pg (ref 26.0–34.0)
MCHC: 33.2 g/dL (ref 32.0–36.0)
MCV: 94.5 fL (ref 80.0–100.0)
PLATELETS: 155 10*3/uL (ref 150–440)
RBC: 3.48 MIL/uL — AB (ref 3.80–5.20)
RDW: 14.8 % — AB (ref 11.5–14.5)
WBC: 9.1 10*3/uL (ref 3.6–11.0)

## 2017-08-04 LAB — BASIC METABOLIC PANEL
Anion gap: 3 — ABNORMAL LOW (ref 5–15)
BUN: 22 mg/dL — AB (ref 6–20)
CHLORIDE: 112 mmol/L — AB (ref 101–111)
CO2: 28 mmol/L (ref 22–32)
CREATININE: 1.08 mg/dL — AB (ref 0.44–1.00)
Calcium: 8.3 mg/dL — ABNORMAL LOW (ref 8.9–10.3)
GFR calc Af Amer: 49 mL/min — ABNORMAL LOW (ref >60)
GFR calc non Af Amer: 42 mL/min — ABNORMAL LOW (ref >60)
GLUCOSE: 119 mg/dL — AB (ref 65–99)
Potassium: 3.6 mmol/L (ref 3.5–5.1)
SODIUM: 143 mmol/L (ref 135–145)

## 2017-08-04 MED ORDER — ENOXAPARIN SODIUM 30 MG/0.3ML ~~LOC~~ SOLN: 30.0000 mg | SUBCUTANEOUS | 0 refills | Status: DC

## 2017-08-04 MED ORDER — OXYCODONE HCL 5 MG PO TABS
5.0000 mg | ORAL_TABLET | ORAL | Status: DC | PRN
  Filled 2017-08-04 (×2): qty 1

## 2017-08-04 MED ORDER — BUSPIRONE HCL 5 MG PO TABS
7.5000 mg | ORAL_TABLET | Freq: Two times a day (BID) | ORAL | Status: DC
  Administered 2017-08-04 – 2017-08-06 (×5): 7.5 mg via ORAL
  Filled 2017-08-04 (×6): qty 1.5

## 2017-08-04 MED ORDER — HALOPERIDOL LACTATE 5 MG/ML IJ SOLN
2.0000 mg | Freq: Once | INTRAMUSCULAR | Status: AC
  Administered 2017-08-04: 2 mg via INTRAVENOUS
  Filled 2017-08-04: qty 1

## 2017-08-04 MED ORDER — LABETALOL HCL 5 MG/ML IV SOLN
10.0000 mg | INTRAVENOUS | Status: DC | PRN
  Administered 2017-08-04: 10 mg via INTRAVENOUS
  Filled 2017-08-04: qty 4

## 2017-08-04 MED ORDER — DOCUSATE SODIUM 100 MG PO CAPS: 100.0000 mg | ORAL_CAPSULE | Freq: Two times a day (BID) | ORAL | 0 refills | Status: AC

## 2017-08-04 MED ORDER — OXYCODONE HCL 5 MG PO TABS
5.0000 mg | ORAL_TABLET | ORAL | 0 refills | Status: DC | PRN
Start: 2017-08-04 — End: 2018-05-03

## 2017-08-04 MED ORDER — NORTRIPTYLINE HCL 50 MG PO CAPS: 50.0000 mg | ORAL_CAPSULE | Freq: Every day | ORAL | Status: AC

## 2017-08-04 NOTE — Progress Notes (Signed)
Plan is for patient to D/C to [facility] Sunday 08/06/17 pending medical clearance. Per Seth Bake admissions coordinator at [facility] patient can come to room 274, RN will call report at [phone]. Clinical Education officer, museum (CSW) sent D/C summary to [facility] Friday via [place]. Patient is aware of above. Patient's son Jenny Reichmann and his wife are aware of above. CSW will continue to follow and assist as needed.   [employer], LCSW [phone]

## 2017-08-04 NOTE — Evaluation (Signed)
Physical Therapy Evaluation Patient Details Name: Amber Reese MRN: [id number] DOB: [birth year] Today's Date: 08/04/2017   History of Present Illness   83 y.o. female with a known history of hypertension, hyperlipidemia, arthritis, CKD, glaucoma presents to hospital secondary to a fall and right hip fx, s/p ORIF.    Clinical Impression  Pt plesantly confused t/o the PT exam, but needed heavy cuing, encouragement and explanation t/o much of the session to stay on tasks and during completion of ~12 minutes of LE exercises in bed.  She needed heavy +2 assist to get to sitting and to the recliner and though she was eager to participate could not assist much.      Follow Up Recommendations SNF    Equipment Recommendations       Recommendations for Other Services       Precautions / Restrictions Precautions Precautions: Fall Restrictions Weight Bearing Restrictions: Yes RLE Weight Bearing: Weight bearing as tolerated      Mobility  Bed Mobility Overal bed mobility: Needs Assistance Bed Mobility: Supine to Sit     Supine to sit: Max assist;+2 for physical assistance     General bed mobility comments: Pt able to maintain sitting at EOB once assisted into position, unable to assist getting to EOB  Transfers Overall transfer level: Needs assistance Equipment used: Rolling walker (2 wheeled);2 person hand held assist Transfers: Sit to/from Stand Sit to Stand: Max assist         General transfer comment: Pt unable to get fully upright w/o heavy assist  Ambulation/Gait             General Gait Details: Pt unable to take even one step, max assist sit/stand transfer to recliner  Stairs            Wheelchair Mobility    Modified Rankin (Stroke Patients Only)       Balance Overall balance assessment: Needs assistance   Sitting balance-Leahy Scale: Fair       Standing balance-Leahy Scale: Zero                               Pertinent  Vitals/Pain      Home Living Family/patient expects to be discharged to:: Skilled nursing facility Living Arrangements:  [facility])                    Prior Function Level of Independence: Independent with assistive device(s)               Hand Dominance        Extremity/Trunk Assessment   Upper Extremity Assessment Upper Extremity Assessment: Generalized weakness (age appropriate limitations)    Lower Extremity Assessment Lower Extremity Assessment: Generalized weakness (tolerated very little R LE movement 2/2 pain, L grossly 4-/5)       Communication   Communication: No difficulties ("low vision" )  Cognition Arousal/Alertness:  (pleasantly confused) Behavior During Therapy: Impulsive Overall Cognitive Status: History of cognitive impairments - at baseline                                 General Comments: Despite repeated explanation pt [place]      General Comments      Exercises General Exercises - Lower Extremity Ankle Circles/Pumps: AROM;10 reps Quad Sets: AROM;10 reps Gluteal Sets: AROM;10 reps Heel Slides:  AAROM;PROM;10 reps Hip ABduction/ADduction: AAROM;10 reps   Assessment/Plan    PT Assessment Patient needs continued PT services  PT Problem List Decreased strength;Decreased range of motion;Decreased activity tolerance;Decreased mobility;Decreased balance;Decreased coordination;Decreased cognition;Decreased knowledge of use of DME;Decreased safety awareness;Pain       PT Treatment Interventions DME instruction;Gait training;Functional mobility training;Therapeutic activities;Therapeutic exercise;Balance training;Neuromuscular re-education;Cognitive remediation;Patient/family education    PT Goals (Current goals can be found in the Care Plan section)  Acute Rehab PT Goals Patient Stated Goal: walk PT Goal Formulation: With patient Time For Goal Achievement: 08/18/17 Potential to  Achieve Goals: Fair    Frequency BID   Barriers to discharge        Co-evaluation               AM-PAC PT "6 Clicks" Daily Activity  Outcome Measure Difficulty turning over in bed (including adjusting bedclothes, sheets and blankets)?: Unable Difficulty moving from lying on back to sitting on the side of the bed? : Unable Difficulty sitting down on and standing up from a chair with arms (e.g., wheelchair, bedside commode, etc,.)?: Unable Help needed moving to and from a bed to chair (including a wheelchair)?: Total Help needed walking in hospital room?: Total Help needed climbing 3-5 steps with a railing? : Total 6 Click Score: 6    End of Session Equipment Utilized During Treatment: Gait belt Activity Tolerance: Patient limited by fatigue;Patient limited by pain Patient left: with chair alarm set;with call bell/phone within reach   PT Visit Diagnosis: Muscle weakness (generalized) (M62.81);Difficulty in walking, not elsewhere classified (R26.2)    Time: 2060-1561 PT Time Calculation (min) (ACUTE ONLY): 27 min   Charges:   PT Evaluation $PT Eval Low Complexity: 1 Low PT Treatments $Therapeutic Exercise: 8-22 mins   PT G Codes:   PT G-Codes **NOT FOR INPATIENT CLASS** Functional Assessment Tool Used: AM-PAC 6 Clicks Basic Mobility Functional Limitation: Mobility: Walking and moving around Mobility: Walking and Moving Around Current Status (B3794): 100 percent impaired, limited or restricted Mobility: Walking and Moving Around Goal Status (F2761): At least 60 percent but less than 80 percent impaired, limited or restricted    Kreg Shropshire, DPT 08/04/2017, 1:25 PM

## 2017-08-04 NOTE — Progress Notes (Signed)
Pt is anxious and confused. Calling out for son and cant be re-oriented by RN. RN called son and MD. Administered haldol. Pt is still anxious but is trying to sleep now.

## 2017-08-04 NOTE — Progress Notes (Signed)
Physical Therapy Treatment Patient Details Name: Amber Reese MRN: [id number] DOB: 1918-07-02 Today's Date: 08/04/2017    History of Present Illness  83 y.o. female with a known history of hypertension, hyperlipidemia, arthritis, CKD, glaucoma presents to hospital secondary to a fall and right hip fx, s/p ORIF.      PT Comments    Pt continues to be confused, but needed more cuing to answer questions, to do exercises and generally had less engagement t/o the session.  She did need a lot of encouragement to continue working with PT/exercises secondary to pain and fatigue.  She got a lot of brief rest breaks between reps and sets of exercises but did show some effort with all tasks despite c/o pain.  Pt's family present and very encouraging, educated regarding expectations, exercise program and answered all other questions.    Follow Up Recommendations  SNF     Equipment Recommendations       Recommendations for Other Services       Precautions / Restrictions Precautions Precautions: Fall Restrictions Weight Bearing Restrictions: Yes RLE Weight Bearing: Weight bearing as tolerated    Mobility  Bed Mobility Overal bed mobility: Needs Assistance Bed Mobility: Sit to Supine     Supine to sit: Max assist;+2 for physical assistance Sit to supine: Max assist;+2 for physical assistance   General bed mobility comments: Pt needed heavy assist to get laying back into bed  Transfers Overall transfer level: Needs assistance Equipment used: Rolling walker (2 wheeled);2 person hand held assist Transfers: Stand Pivot Transfers Sit to Stand: Max assist Stand pivot transfers: Max assist;Total assist       General transfer comment: Pt unable to show assistance with WBing during dependent transfer back to bed  Ambulation/Gait             General Gait Details: Pt unable to take even one step, max assist sit/stand transfer to recliner   Stairs            Wheelchair  Mobility    Modified Rankin (Stroke Patients Only)       Balance Overall balance assessment: Needs assistance   Sitting balance-Leahy Scale: Fair       Standing balance-Leahy Scale: Zero                              Cognition Arousal/Alertness: Lethargic;Suspect due to medications Behavior During Therapy: [facility] for tasks assessed/performed Overall Cognitive Status: History of cognitive impairments - at baseline                                 General Comments: Despite repeated explanation pt [place]      Exercises General Exercises - Lower Extremity Ankle Circles/Pumps: AROM;10 reps Quad Sets: AROM;10 reps Gluteal Sets: AROM;10 reps Short Arc Quad: AROM;10 reps Heel Slides: AAROM;PROM;10 reps Hip ABduction/ADduction: AAROM;10 reps Straight Leg Raises:  (attempted, pt uanble to assist)    General Comments        Pertinent Vitals/Pain Pain Assessment: 0-10 Pain Score: 8     Home Living Family/patient expects to be discharged to:: Skilled nursing facility Living Arrangements:  [facility])                  Prior Function Level of Independence: Independent with assistive device(s)          PT  Goals (current goals can now be found in the care plan section) Acute Rehab PT Goals Patient Stated Goal: walk PT Goal Formulation: With patient Time For Goal Achievement: 08/18/17 Potential to Achieve Goals: Fair Progress towards PT goals: Progressing toward goals    Frequency    BID      PT Plan Current plan remains appropriate    Co-evaluation              AM-PAC PT "6 Clicks" Daily Activity  Outcome Measure  Difficulty turning over in bed (including adjusting bedclothes, sheets and blankets)?: Unable Difficulty moving from lying on back to sitting on the side of the bed? : Unable Difficulty sitting down on and standing up from a chair with arms (e.g., wheelchair, bedside commode,  etc,.)?: Unable Help needed moving to and from a bed to chair (including a wheelchair)?: Total Help needed walking in hospital room?: Total Help needed climbing 3-5 steps with a railing? : Total 6 Click Score: 6    End of Session Equipment Utilized During Treatment: Gait belt Activity Tolerance: Patient limited by fatigue;Patient limited by pain Patient left: with call bell/phone within reach;with bed alarm set;with family/visitor present Nurse Communication: Mobility status PT Visit Diagnosis: Muscle weakness (generalized) (M62.81);Difficulty in walking, not elsewhere classified (R26.2)     Time: 4680-3212 PT Time Calculation (min) (ACUTE ONLY): 40 min  Charges:  $Therapeutic Exercise: 23-37 mins $Therapeutic Activity: 8-22 mins                    G Codes:  Functional Assessment Tool Used: AM-PAC 6 Clicks Basic Mobility Functional Limitation: Mobility: Walking and moving around Mobility: Walking and Moving Around Current Status (Y4825): 100 percent impaired, limited or restricted Mobility: Walking and Moving Around Goal Status (O0370): At least 60 percent but less than 80 percent impaired, limited or restricted    Kreg Shropshire, DPT 08/04/2017, 4:49 PM

## 2017-08-04 NOTE — Progress Notes (Signed)
[facility] [place] NAME: Amber Reese    MR#:  [id number]  DATE OF BIRTH:  26-Feb-1918  SUBJECTIVE:  CHIEF COMPLAINT:   Chief Complaint  Patient presents with  . Fall  . Hip Pain   - Pleasant and cooperative this morning. Periods of agitation and confusion noted last night. -Postop day 1 after right hip surgery. Foley catheter removed.  REVIEW OF SYSTEMS:  Review of Systems  Constitutional: Negative for chills, fever and malaise/fatigue.  HENT: Negative for congestion, ear discharge, hearing loss and nosebleeds.   Eyes: Negative for blurred vision and double vision.  Respiratory: Negative for cough, shortness of breath and wheezing.   Cardiovascular: Negative for chest pain, palpitations and leg swelling.  Gastrointestinal: Negative for abdominal pain, constipation, diarrhea, nausea and vomiting.  Genitourinary: Negative for dysuria.  Musculoskeletal: Positive for joint pain and myalgias.  Neurological: Negative for dizziness, seizures and headaches.  Psychiatric/Behavioral:       Confusion    DRUG ALLERGIES:   Allergies  Allergen Reactions  . Amoxicillin     VITALS:  Blood pressure (!) 159/61, pulse 96, temperature 98.2 F (36.8 C), temperature source Oral, resp. rate 19, height 4\' 9"  (1.448 m), weight 68.9 kg (152 lb), SpO2 92 %.  PHYSICAL EXAMINATION:  Physical Exam  GENERAL:  83 y.o.-year-old patient lying in the bed with no acute distress.  EYES: left Pupil round, reactive to light and accommodation. Right eye opacified cornea. No scleral icterus. Extraocular muscles intact.  HEENT: Head atraumatic, normocephalic. Oropharynx and nasopharynx clear.  NECK:  Supple, no jugular venous distention. No thyroid enlargement, no tenderness.  LUNGS: Normal breath sounds bilaterally, no wheezing, rales,rhonchi or crepitation. No use of accessory muscles of respiration.  CARDIOVASCULAR: S1, S2 normal. No  rubs, or gallops. 3/6  systolic murmur is present ABDOMEN: Soft, nontender, nondistended. Bowel sounds present. No organomegaly or mass.  EXTREMITIES: No pedal edema, cyanosis, or clubbing. Right hip pain, right hip edema at surgical site noted. NEUROLOGIC: Cranial nerves II through XII are intact. Muscle strength 5/5 in all extremities except limited rotation of right hip due to pain. Sensation intact. Gait not checked.  PSYCHIATRIC: The patient is alert and oriented x 3. Intermittent confusion noticed that improves with re-orientation  SKIN: No obvious rash, lesion, or ulcer.    LABORATORY PANEL:   CBC  Recent Labs Lab 08/04/17 0418  WBC 9.1  HGB 10.9*  HCT 32.9*  PLT 155   ------------------------------------------------------------------------------------------------------------------  Chemistries   Recent Labs Lab 08/04/17 0418  NA 143  K 3.6  CL 112*  CO2 28  GLUCOSE 119*  BUN 22*  CREATININE 1.08*  CALCIUM 8.3*   ------------------------------------------------------------------------------------------------------------------  Cardiac Enzymes No results for input(s): TROPONINI in the last 168 hours. ------------------------------------------------------------------------------------------------------------------  RADIOLOGY:  Dg Chest 1 View  Result Date: 08/02/2017 CLINICAL DATA:  Hip fracture EXAM: CHEST 1 VIEW COMPARISON:  01/12/2016 FINDINGS: Mild cardiomegaly with aortic atherosclerosis. Mild diffuse interstitial opacity likely chronic change. No consolidation or effusion. No pneumothorax. IMPRESSION: Mild cardiomegaly.  Negative for edema or infiltrate. Electronically Signed   By: Donavan Foil M.D.   On: 08/02/2017 19:18   Ct Head Wo Contrast  Result Date: 08/02/2017 CLINICAL DATA:  Trauma to the right side of head after trip and fall today. EXAM: CT HEAD WITHOUT CONTRAST TECHNIQUE: Contiguous axial images were obtained from the base of the skull through the vertex without  intravenous contrast. COMPARISON:  None. FINDINGS: BRAIN: There is  sulcal and ventricular prominence consistent with superficial and central atrophy. No intraparenchymal hemorrhage, mass effect nor midline shift. 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 Periventricular and subcortical white matter hypodensities consistent with chronic small vessel ischemic disease are identified. No acute large vascular territory infarcts. No abnormal extra-axial fluid collections. Basal cisterns are not effaced and midline. VASCULAR: Marked calcific atherosclerosis of the carotid siphons. No hyperdense vessels. SKULL: No skull fracture. No significant scalp soft tissue swelling. SINUSES/ORBITS: The mastoid air-cells are clear. The included paranasal sinuses are well-aerated.The included ocular globes and orbital contents are non-suspicious. Right cataract extraction and glaucoma reservoir is again seen. OTHER: None. IMPRESSION: 1. Atrophy with chronic small vessel ischemic disease. Remote infarct of the right lenticulostriate. No acute intracranial abnormality. 2. Re- demonstration of extensor atherosclerotic calcifications at the skullbase. Electronically Signed   By: Ashley Royalty M.D.   On: 08/02/2017 19:40   Dg Hip Operative Unilat W Or W/o Pelvis Right  Result Date: 08/03/2017 CLINICAL DATA:  Right intramedullary nail. EXAM: OPERATIVE right HIP (WITH PELVIS IF PERFORMED) 5 VIEWS TECHNIQUE: Fluoroscopic spot image(s) were submitted for interpretation post-operatively. COMPARISON:  08/02/2017 FINDINGS: Five intraoperative spot fluoro film show placement of a long antegrade intramedullary nail in the femur with dynamic hip screw crossing the comminuted intertrochanteric femoral neck fracture. No evidence for immediate hardware complications. Bony alignment is markedly improved compared to the pre reduction film. IMPRESSION: Status post ORIF for right femoral neck fracture without  complicating features. Electronically Signed   By: Misty Stanley M.D.   On: 08/03/2017 20:01   Dg Hip Unilat  With Pelvis 2-3 Views Right  Result Date: 08/02/2017 CLINICAL DATA:  Fall with shortening of the like EXAM: DG HIP (WITH OR WITHOUT PELVIS) 2-3V RIGHT COMPARISON:  None. FINDINGS: Pubic symphysis is intact.  No fracture or dislocation on the left. Acute, comminuted and displaced fracture involving the base of the femoral neck and the trochanter. Laterally displaced greater trochanteric fracture fragment. IMPRESSION: Acute, comminuted and displaced right proximal femoral fracture involving the base of the femoral neck and the trochanter. Electronically Signed   By: Donavan Foil M.D.   On: 08/02/2017 19:17    EKG:   Orders placed or performed during the hospital encounter of 08/02/17  . ED EKG  . ED EKG    ASSESSMENT AND PLAN:   Amber Reese  is a 83 y.o. female with a known history of hypertension, hyperlipidemia, arthritis, CKD, glaucoma presents to hospital secondary to a fall and right hip pain.  #1 right femoral neck fracture-secondary to mechanical fall. - POD #1 after ORIF, appreciate orthopedics consult - foley removed,  Physical therapy will be started today -Pain control with meds. -will need rehabilitation at discharge.  #2 hypothyroidism-continue Synthroid  #3 glaucoma-continue eyedrops  #4 DVT prophylaxis- lovenox for 2 weeks  Social worker consulted.      All the records are reviewed and case discussed with Care Management/Social Workerr. Management plans discussed with the patient, family and they are in agreement.  CODE STATUS: DNR  TOTAL TIME TAKING CARE OF THIS PATIENT: 37 minutes.   POSSIBLE D/C IN 2-3 DAYS, DEPENDING ON CLINICAL CONDITION.   Gladstone Lighter M.D on 08/04/2017 at 10:43 AM  Between 7am to 6pm - Pager - [phone]  After 6pm go to [email or web address] - password [email or web address] [facility]  Office   [phone]  CC: Primary care physician; Kirk Ruths, MD

## 2017-08-04 NOTE — Progress Notes (Signed)
Pt became upset with the IV pump and dinamap alarms and "everything that is going on". Peripheral IV on the the right AC, intact, dressing reinforced. This Probation officer has explained to pt multiple times that she needs to be monitored after surgery and that we needed the machines to check on her. Pt wanted her foley catheter out and refused to take scheduled Tylenol. Pt stated she is being "hooked on everything". This Probation officer reinforced information and education about keeping the foley catheter for tonight. Pt calmed down after explanation and encouragement however she still refused to take scheduled Tylenol. Pt stated "she needs to sleep". Bed alarm on, will continue to monitor and attend to pt's needs.

## 2017-08-04 NOTE — Discharge Summary (Signed)
Amber Reese at [place] NAME: Amber Reese    MR#:  [id number]  DATE OF BIRTH:  1918-04-22  DATE OF ADMISSION:  08/02/2017   ADMITTING PHYSICIAN: Gladstone Lighter, MD  DATE OF DISCHARGE: 08/06/2017  PRIMARY CARE PHYSICIAN: Kirk Ruths, MD   ADMISSION DIAGNOSIS:   Fall, initial encounter [phone]] Closed fracture of right hip, initial encounter ([place]) [S72.001A]  DISCHARGE DIAGNOSIS:   Active Problems:   Hip fracture ([place])   SECONDARY DIAGNOSIS:   Past Medical History:  Diagnosis Date  . Chronic kidney disease   . Depression   . Glaucoma   . Hyperlipidemia   . Hypertension   . Left ventricular failure ([place])   . Osteoarthritis   . Polyosteoarthritis   . Thyroid disease     HOSPITAL COURSE:   Amber Reese a 83 y.o. femalewith a known history of hypertension, hyperlipidemia, arthritis, CKD, glaucoma presents to hospital secondary to a fall and right hip pain.  #1 right femoral neck fracture-secondary to mechanical fall. - POD #1 after ORIF, appreciate orthopedics consult - foley removed,  Physical therapy will be started today -Pain control with meds. -Postoperative hemoglobin is stable at this time. No indication for transfusion at this time. -will need rehabilitation at discharge.  #2 hypothyroidism-continue Synthroid  #3 glaucoma-continue eyedrops  #4 DVT prophylaxis- lovenox for 2 weeks  Social worker consulted. DISCHARGE CONDITIONS:   Guarded  CONSULTS OBTAINED:   Treatment Team:  Corky Mull, MD  DRUG ALLERGIES:   Allergies  Allergen Reactions  . Amoxicillin    DISCHARGE MEDICATIONS:   Allergies as of 08/04/2017      Reactions   Amoxicillin       Medication List    STOP taking these medications   cephALEXin 500 MG capsule Commonly known as:  KEFLEX   tolterodine 4 MG 24 hr capsule Commonly known as:  DETROL LA     TAKE these medications   acetaminophen 500 MG  tablet Commonly known as:  TYLENOL Take 500 mg by mouth every 6 (six) hours as needed for mild pain or moderate pain.   alum & mag hydroxide-simeth 200-200-20 MG/5ML suspension Commonly known as:  MAALOX/MYLANTA Take 30 mLs by mouth every 4 (four) hours as needed for indigestion or flatulence.   aspirin EC 81 MG tablet Take 1 tablet (81 mg total) by mouth daily.   bismuth subsalicylate 607 PX/10GY suspension Commonly known as:  PEPTO BISMOL Take 10 mLs by mouth 3 (three) times daily as needed for diarrhea or loose stools.   busPIRone 7.5 MG tablet Commonly known as:  BUSPAR Take 7.5 mg by mouth 2 (two) times daily.   carbamide peroxide 6.5 % OTIC solution Commonly known as:  DEBROX Place 5 drops into both ears daily as needed. Instill 5 drops into affected ears daily for 5 days then irrigate/remove cerumen due to impaction as needed   carboxymethylcellulose 0.5 % Soln Commonly known as:  REFRESH PLUS 1 drop 4 (four) times daily as needed.   dextromethorphan-guaiFENesin 10-100 MG/5ML liquid Commonly known as:  ROBITUSSIN-DM Take 10 mLs by mouth every 4 (four) hours as needed for cough.   Difluprednate 0.05 % Emul Place 1 drop into the right eye 4 (four) times daily.   diphenhydrAMINE 25 MG tablet Commonly known as:  BENADRYL Take 25 mg by mouth every 4 (four) hours as needed for itching or allergies.   docusate sodium 100 MG capsule Commonly known as:  COLACE Take  1 capsule (100 mg total) by mouth 2 (two) times daily.   enoxaparin 30 MG/0.3ML injection Commonly known as:  LOVENOX Inject 0.3 mLs (30 mg total) into the skin daily. X 2 weeks   latanoprost 0.005 % ophthalmic solution Commonly known as:  XALATAN Place 1 drop into the left eye at bedtime.   levothyroxine 50 MCG tablet Commonly known as:  SYNTHROID, LEVOTHROID Take 1 tablet (50 mcg total) by mouth daily. What changed:  how much to take  additional instructions   magnesium hydroxide 400 MG/5ML  suspension Commonly known as:  MILK OF MAGNESIA Take 30 mLs by mouth daily as needed for mild constipation.   nortriptyline 50 MG capsule Commonly known as:  PAMELOR Take 1 capsule (50 mg total) by mouth at bedtime.   nystatin powder Commonly known as:  MYCOSTATIN/NYSTOP Apply topically 2 (two) times daily. Apply twice daily under breasts and to groin/abdominal fold areas as needed for rash.   OCUSOFT EYELID CLEANSING EX Apply 2 application topically. Use 2 applications every Monday, Wednesday and Friday. Use 1 pad per eye.   oxyCODONE 5 MG immediate release tablet Commonly known as:  Oxy IR/ROXICODONE Take 1 tablet (5 mg total) by mouth every 4 (four) hours as needed for moderate pain, severe pain or breakthrough pain.   polyethylene glycol packet Commonly known as:  MIRALAX / GLYCOLAX Take 17 g by mouth daily.   sodium chloride 5 % ophthalmic solution Commonly known as:  MURO 128 Place 1 drop into the left eye 2 (two) times daily. 30 minutes before or after prescription drops   timolol 0.5 % ophthalmic solution Commonly known as:  BETIMOL Place 1 drop into the left eye 2 (two) times daily.   Vitamin D2 2000 units Tabs Take 2,000 Units by mouth daily.        DISCHARGE INSTRUCTIONS:   1. PCP f/u in 2-3 weeks 2. Orthopedics f/u in 10 days  DIET:   Cardiac diet  ACTIVITY:   Activity as tolerated  OXYGEN:   Home Oxygen: No.  Oxygen Delivery: room air  DISCHARGE LOCATION:   nursing home   If you experience worsening of your admission symptoms, develop shortness of breath, life threatening emergency, suicidal or homicidal thoughts you must seek medical attention immediately by calling 911 or calling your MD immediately  if symptoms less severe.  You Must read complete instructions/literature along with all the possible adverse reactions/side effects for all the Medicines you take and that have been prescribed to you. Take any new Medicines after you have  completely understood and accpet all the possible adverse reactions/side effects.   Please note  You were cared for by a hospitalist during your hospital stay. If you have any questions about your discharge medications or the care you received while you were in the hospital after you are discharged, you can call the unit and asked to speak with the hospitalist on call if the hospitalist that took care of you is not available. Once you are discharged, your primary care physician will handle any further medical issues. Please note that NO REFILLS for any discharge medications will be authorized once you are discharged, as it is imperative that you return to your primary care physician (or establish a relationship with a primary care physician if you do not have one) for your aftercare needs so that they can reassess your need for medications and monitor your lab values.    On the day of Discharge:  VITAL SIGNS:  Blood pressure (!) 159/61, pulse 96, temperature 98.2 F (36.8 C), temperature source Oral, resp. rate 19, height 4\' 9"  (1.448 m), weight 68.9 kg (152 lb), SpO2 92 %.  PHYSICAL EXAMINATION:    GENERAL: 83 y.o.-year-old patient lying in the bed with no acute distress.  EYES: left Pupil round, reactive to light and accommodation. Right eye opacified cornea.No scleral icterus. Extraocular muscles intact.  HEENT: Head atraumatic, normocephalic. Oropharynx and nasopharynx clear.  NECK: Supple, no jugular venous distention. No thyroid enlargement, no tenderness.  LUNGS: Normal breath sounds bilaterally, no wheezing, rales,rhonchi or crepitation. No use of accessory muscles of respiration.  CARDIOVASCULAR: S1, S2 normal. No rubs, or gallops. 3/6 systolic murmur is present ABDOMEN: Soft, nontender, nondistended. Bowel sounds present. No organomegaly or mass.  EXTREMITIES: No pedal edema, cyanosis, or clubbing. Right hip pain, right hip edema at surgical site noted. NEUROLOGIC: Cranial  nerves II through XII are intact. Muscle strength 5/5 in all extremities except limited rotation of right hip due to pain. Sensation intact. Gait not checked.  PSYCHIATRIC: The patient is alert and oriented x 3. Intermittent confusion noticed that improves with re-orientation  SKIN: No obvious rash, lesion, or ulcer.   DATA REVIEW:   CBC  Recent Labs Lab 08/04/17 0418  WBC 9.1  HGB 10.9*  HCT 32.9*  PLT 155    Chemistries   Recent Labs Lab 08/04/17 0418  NA 143  K 3.6  CL 112*  CO2 28  GLUCOSE 119*  BUN 22*  CREATININE 1.08*  CALCIUM 8.3*     Microbiology Results  Results for orders placed or performed during the hospital encounter of 08/02/17  MRSA PCR Screening     Status: None   Collection Time: 08/02/17 10:25 PM  Result Value Ref Range Status   MRSA by PCR NEGATIVE NEGATIVE Final    Comment:        The GeneXpert MRSA Assay (FDA approved for NASAL specimens only), is one component of a comprehensive MRSA colonization surveillance program. It is not intended to diagnose MRSA infection nor to guide or monitor treatment for MRSA infections.     RADIOLOGY:  Dg Hip Operative Unilat W Or W/o Pelvis Right  Result Date: 08/03/2017 CLINICAL DATA:  Right intramedullary nail. EXAM: OPERATIVE right HIP (WITH PELVIS IF PERFORMED) 5 VIEWS TECHNIQUE: Fluoroscopic spot image(s) were submitted for interpretation post-operatively. COMPARISON:  08/02/2017 FINDINGS: Five intraoperative spot fluoro film show placement of a long antegrade intramedullary nail in the femur with dynamic hip screw crossing the comminuted intertrochanteric femoral neck fracture. No evidence for immediate hardware complications. Bony alignment is markedly improved compared to the pre reduction film. IMPRESSION: Status post ORIF for right femoral neck fracture without complicating features. Electronically Signed   By: Misty Stanley M.D.   On: 08/03/2017 20:01     Management plans discussed with the  patient, family and they are in agreement.  CODE STATUS:     Code Status Orders        Start     Ordered   08/02/17 2200  Do not attempt resuscitation (DNR)  Continuous    Question Answer Comment  In the event of cardiac or respiratory ARREST Do not call a "code blue"   In the event of cardiac or respiratory ARREST Do not perform Intubation, CPR, defibrillation or ACLS   In the event of cardiac or respiratory ARREST Use medication by any route, position, wound care, and other measures to relive pain and suffering. May use oxygen, suction  and manual treatment of airway obstruction as needed for comfort.      08/02/17 2200    Code Status History    Date Active Date Inactive Code Status Order ID Comments User Context   This patient has a current code status but no historical code status.    Advance Directive Documentation     Most Recent Value  Type of Advance Directive  Healthcare Power of Attorney, Living will  Pre-existing out of facility DNR order (yellow form or pink MOST form)  Yellow form placed in chart (order not valid for inpatient use)  "MOST" Form in Place?  -      TOTAL TIME TAKING CARE OF THIS PATIENT: 38 minutes.    Gladstone Lighter M.D on 08/04/2017 at 10:50 AM  Between 7am to 6pm - Pager - [phone]  After 6pm go to [email or web address] - [employer] [facility]  Office  [phone]  CC: Primary care physician; Kirk Ruths, MD   Note: This dictation was prepared with Dragon dictation along with smaller phrase technology. Any transcriptional errors that result from this process are unintentional.

## 2017-08-05 LAB — BASIC METABOLIC PANEL
ANION GAP: 6 (ref 5–15)
BUN: 20 mg/dL (ref 6–20)
CALCIUM: 8.5 mg/dL — AB (ref 8.9–10.3)
CHLORIDE: 107 mmol/L (ref 101–111)
CO2: 27 mmol/L (ref 22–32)
Creatinine, Ser: 1.15 mg/dL — ABNORMAL HIGH (ref 0.44–1.00)
GFR calc non Af Amer: 39 mL/min — ABNORMAL LOW (ref >60)
GFR, EST AFRICAN AMERICAN: 45 mL/min — AB (ref >60)
GLUCOSE: 112 mg/dL — AB (ref 65–99)
Potassium: 3.7 mmol/L (ref 3.5–5.1)
Sodium: 140 mmol/L (ref 135–145)

## 2017-08-05 LAB — CBC
HCT: 31.8 % — ABNORMAL LOW (ref 35.0–47.0)
HEMOGLOBIN: 10.9 g/dL — AB (ref 12.0–16.0)
MCH: 31.8 pg (ref 26.0–34.0)
MCHC: 34.2 g/dL (ref 32.0–36.0)
MCV: 93 fL (ref 80.0–100.0)
Platelets: 161 10*3/uL (ref 150–440)
RBC: 3.42 MIL/uL — AB (ref 3.80–5.20)
RDW: 14.4 % (ref 11.5–14.5)
WBC: 9.9 10*3/uL (ref 3.6–11.0)

## 2017-08-05 NOTE — Progress Notes (Signed)
Physical Therapy Treatment Patient Details Name: Amber Reese MRN: [id number] DOB: May 23, 1918 Today's Date: 08/05/2017    History of Present Illness  83 y.o. female with a known history of hypertension, hyperlipidemia, arthritis, CKD, glaucoma presents to hospital secondary to a fall and right hip fx, s/p ORIF.      PT Comments    Awake and alert this am.  Appropriate conversation and engagement.  Participated in exercises as described below.  Generally resists P/AAROM for RLE due to pain/fear of pain.  To edge of bed with max a x 1 and mod a x 1.  Sitting edge of bed with min assist.  Unsafe to be left unattended.  Stood with walker and max a x 2.  She remained with stooped posture and was unable to take any steps with walker.  Pt returned to sitting and was able to stand pivot with max a x 2 to recliner at bedside without assistive device.     Follow Up Recommendations  SNF     Equipment Recommendations       Recommendations for Other Services       Precautions / Restrictions Precautions Precautions: Fall Restrictions Weight Bearing Restrictions: Yes RLE Weight Bearing: Weight bearing as tolerated    Mobility  Bed Mobility Overal bed mobility: Needs Assistance Bed Mobility: Sit to Supine     Supine to sit: Max assist;+2 for physical assistance        Transfers Overall transfer level: Needs assistance Equipment used: Rolling walker (2 wheeled);2 person hand held assist Transfers: Stand Pivot Transfers Sit to Stand: Max assist;+2 physical assistance Stand pivot transfers: Max assist;+2 physical assistance       General transfer comment: Stood with walker and stooped posture with walker, unable to move feet.  Ambulation/Gait             General Gait Details: Pt unable to take even one step, max assist sit/stand transfer to recliner   Stairs            Wheelchair Mobility    Modified Rankin (Stroke Patients Only)       Balance Overall  balance assessment: Needs assistance Sitting-balance support: Feet supported Sitting balance-Leahy Scale: Fair       Standing balance-Leahy Scale: Zero                              Cognition Arousal/Alertness: Awake/alert Behavior During Therapy: WFL for tasks assessed/performed Overall Cognitive Status: Within Functional Limits for tasks assessed                                        Exercises General Exercises - Lower Extremity Ankle Circles/Pumps: AROM;10 reps Quad Sets: AROM;10 reps Gluteal Sets: AROM;10 reps Short Arc Quad: 10 reps;AAROM;Right;Supine Heel Slides: AAROM;PROM;10 reps Hip ABduction/ADduction: AAROM;10 reps    General Comments        Pertinent Vitals/Pain Pain Assessment: 0-10 Pain Score: 6  Pain Descriptors / Indicators: Operative site guarding;Sore Pain Intervention(s): Limited activity within patient's tolerance;Repositioned    Home Living                      Prior Function            PT Goals (current goals can now be found in the care plan section) Progress towards PT goals: Progressing toward goals  Frequency    BID      PT Plan Current plan remains appropriate    Co-evaluation              AM-PAC PT "6 Clicks" Daily Activity  Outcome Measure  Difficulty turning over in bed (including adjusting bedclothes, sheets and blankets)?: Unable Difficulty moving from lying on back to sitting on the side of the bed? : Unable Difficulty sitting down on and standing up from a chair with arms (e.g., wheelchair, bedside commode, etc,.)?: Unable Help needed moving to and from a bed to chair (including a wheelchair)?: Total Help needed walking in hospital room?: Total Help needed climbing 3-5 steps with a railing? : Total 6 Click Score: 6    End of Session Equipment Utilized During Treatment: Gait belt Activity Tolerance: Patient limited by fatigue;Patient limited by pain Patient left: with  call bell/phone within reach;in chair;with chair alarm set Nurse Communication: Mobility status       Time: 0938-1829 PT Time Calculation (min) (ACUTE ONLY): 24 min  Charges:  $Therapeutic Exercise: 8-22 mins $Therapeutic Activity: 8-22 mins                    G Codes:       Chesley Noon, PTA 08/05/17, 9:28 AM

## 2017-08-05 NOTE — Anesthesia Postprocedure Evaluation (Signed)
Anesthesia Post Note  Patient: Torryn Fiske  Procedure(s) Performed: INTRAMEDULLARY (IM) NAIL INTERTROCHANTRIC (Right Hip)  Patient location during evaluation: PACU Anesthesia Type: Spinal Level of consciousness: oriented and awake and alert Pain management: pain level controlled Vital Signs Assessment: post-procedure vital signs reviewed and stable Respiratory status: spontaneous breathing, respiratory function stable and patient connected to nasal cannula oxygen Cardiovascular status: blood pressure returned to baseline and stable Postop Assessment: no headache, no backache and no apparent nausea or vomiting Anesthetic complications: no     Last Vitals:  Vitals:   08/05/17 0045 08/05/17 0718  BP: (!) 147/50 (!) 150/55  Pulse: (!) 102 100  Resp: 20 16  Temp: 37.8 C 36.9 C  SpO2: 90% 95%    Last Pain:  Vitals:   08/05/17 0718  TempSrc: Oral  PainSc: 0-No pain                 Yevonne Yokum S

## 2017-08-05 NOTE — Progress Notes (Signed)
[place] [facility] [place] NAME: Amber Reese    MR#:  [id number]  DATE OF BIRTH:  04/26/1918  SUBJECTIVE:  CHIEF COMPLAINT:   Chief Complaint  Patient presents with  . Fall  . Hip Pain   -Postop day 2 after right hip surgery. And confusion last night, improved with re- orientation and Haldol. -Very pleasant this morning. To rehabilitation tomorrow  REVIEW OF SYSTEMS:  Review of Systems  Constitutional: Negative for chills, fever and malaise/fatigue.  HENT: Negative for congestion, ear discharge, hearing loss and nosebleeds.   Eyes: Negative for blurred vision and double vision.  Respiratory: Negative for cough, shortness of breath and wheezing.   Cardiovascular: Negative for chest pain, palpitations and leg swelling.  Gastrointestinal: Positive for constipation. Negative for abdominal pain, diarrhea, nausea and vomiting.  Genitourinary: Negative for dysuria.  Musculoskeletal: Positive for joint pain. Negative for myalgias.  Neurological: Negative for dizziness, seizures and headaches.  Psychiatric/Behavioral:       Confusion    DRUG ALLERGIES:   Allergies  Allergen Reactions  . Amoxicillin     VITALS:  Blood pressure (!) 150/55, pulse 100, temperature 98.4 F (36.9 C), temperature source Oral, resp. rate 16, height 4\' 9"  (1.448 m), weight 68.9 kg (152 lb), SpO2 95 %.  PHYSICAL EXAMINATION:  Physical Exam  GENERAL:  83 y.o.-year-old patient lying in the bed with no acute distress.  EYES: left Pupil round, reactive to light and accommodation. Right eye opacified cornea. No scleral icterus. Extraocular muscles intact.  HEENT: Head atraumatic, normocephalic. Oropharynx and nasopharynx clear.  NECK:  Supple, no jugular venous distention. No thyroid enlargement, no tenderness.  LUNGS: Normal breath sounds bilaterally, no wheezing, rales,rhonchi or crepitation. No use of accessory muscles of respiration.  CARDIOVASCULAR: S1, S2  normal. No  rubs, or gallops. 3/6 systolic murmur is present ABDOMEN: Soft, nontender, nondistended. Bowel sounds present. No organomegaly or mass.  EXTREMITIES: No pedal edema, cyanosis, or clubbing. Right hip pain, right hip edema at surgical site noted. NEUROLOGIC: Cranial nerves II through XII are intact. Muscle strength 5/5 in all extremities except limited rotation of right hip due to pain. Sensation intact. Gait not checked.  PSYCHIATRIC: The patient is alert and oriented x 3. Intermittent confusion noticed that improves with re-orientation  SKIN: No obvious rash, lesion, or ulcer.    LABORATORY PANEL:   CBC  Recent Labs Lab 08/05/17 0338  WBC 9.9  HGB 10.9*  HCT 31.8*  PLT 161   ------------------------------------------------------------------------------------------------------------------  Chemistries   Recent Labs Lab 08/05/17 0338  NA 140  K 3.7  CL 107  CO2 27  GLUCOSE 112*  BUN 20  CREATININE 1.15*  CALCIUM 8.5*   ------------------------------------------------------------------------------------------------------------------  Cardiac Enzymes No results for input(s): TROPONINI in the last 168 hours. ------------------------------------------------------------------------------------------------------------------  RADIOLOGY:  Dg Hip Operative Unilat W Or W/o Pelvis Right  Result Date: 08/03/2017 CLINICAL DATA:  Right intramedullary nail. EXAM: OPERATIVE right HIP (WITH PELVIS IF PERFORMED) 5 VIEWS TECHNIQUE: Fluoroscopic spot image(s) were submitted for interpretation post-operatively. COMPARISON:  08/02/2017 FINDINGS: Five intraoperative spot fluoro film show placement of a long antegrade intramedullary nail in the femur with dynamic hip screw crossing the comminuted intertrochanteric femoral neck fracture. No evidence for immediate hardware complications. Bony alignment is markedly improved compared to the pre reduction film. IMPRESSION: Status post ORIF  for right femoral neck fracture without complicating features. Electronically Signed   By: Misty Stanley M.D.   On: 08/03/2017 20:01  EKG:   Orders placed or performed during the hospital encounter of 08/02/17  . ED EKG  . ED EKG    ASSESSMENT AND PLAN:   Amber Reese  is a 83 y.o. female with a known history of hypertension, hyperlipidemia, arthritis, CKD, glaucoma presents to hospital secondary to a fall and right hip pain.  #1 right femoral neck fracture-secondary to mechanical fall. - POD #2 after ORIF, appreciate orthopedics consult - foley removed,  Physical therapy started -Pain control with meds. -will need rehabilitation at discharge. - laxatives for constipation  #2 hypothyroidism-continue Synthroid  #3 glaucoma-continue eyedrops  #4 DVT prophylaxis- lovenox for 2 weeks  Social worker consulted. To [facility] tomorrow     All the records are reviewed and case discussed with Care Management/Social Workerr. Management plans discussed with the patient, family and they are in agreement.  CODE STATUS: DNR  TOTAL TIME TAKING CARE OF THIS PATIENT: 33 minutes.   POSSIBLE D/C TOMORROW, DEPENDING ON CLINICAL CONDITION.   Elan Mcelvain M.D on 08/05/2017 at 10:19 AM  Between 7am to 6pm - Pager - [phone]  After 6pm go to [email or web address] - password [email or web address] [facility]  Office  [phone]  CC: Primary care physician; Kirk Ruths, MD

## 2017-08-05 NOTE — Progress Notes (Addendum)
   Subjective: 2 Days Post-Op Procedure(s) (LRB): INTRAMEDULLARY (IM) NAIL INTERTROCHANTRIC (Right) Patient reports pain as 0 on 0-10 scale.   Patient is well, and has had no acute complaints or problems Denies any CP, SOB, ABD pain. We will continue therapy today.  Plan is to go Skilled nursing facility after hospital stay.  Objective: Vital signs in last 24 hours: Temp:  [98.4 F (36.9 C)-100.1 F (37.8 C)] 98.4 F (36.9 C) (10/20 0718) Pulse Rate:  [93-111] 100 (10/20 0718) Resp:  [16-20] 16 (10/20 0718) BP: (138-191)/(50-68) 150/55 (10/20 0718) SpO2:  [79 %-95 %] 95 % (10/20 0718)  Intake/Output from previous day: No intake/output data recorded. Intake/Output this shift: No intake/output data recorded.   Recent Labs  08/02/17 1859 08/03/17 0436 08/04/17 0418 08/05/17 0338  HGB 14.7 12.8 10.9* 10.9*    Recent Labs  08/04/17 0418 08/05/17 0338  WBC 9.1 9.9  RBC 3.48* 3.42*  HCT 32.9* 31.8*  PLT 155 161    Recent Labs  08/04/17 0418 08/05/17 0338  NA 143 140  K 3.6 3.7  CL 112* 107  CO2 28 27  BUN 22* 20  CREATININE 1.08* 1.15*  GLUCOSE 119* 112*  CALCIUM 8.3* 8.5*    Recent Labs  08/02/17 1859  INR 0.93    EXAM General - Patient is Alert, Appropriate and Oriented Extremity - Neurovascular intact Sensation intact distally Intact pulses distally Dorsiflexion/Plantar flexion intact No cellulitis present Compartment soft Dressing - dressing C/D/I and no drainage Motor Function - intact, moving foot and toes well on exam.   Past Medical History:  Diagnosis Date  . Chronic kidney disease   . Depression   . Glaucoma   . Hyperlipidemia   . Hypertension   . Left ventricular failure ([place])   . Osteoarthritis   . Polyosteoarthritis   . Thyroid disease     Assessment/Plan:   2 Days Post-Op Procedure(s) (LRB): INTRAMEDULLARY (IM) NAIL INTERTROCHANTRIC (Right) Active Problems:   Hip fracture (HCC)  Estimated body mass index is 32.89  kg/m as calculated from the following:   Height as of this encounter: 4\' 9"  (1.448 m).   Weight as of this encounter: 68.9 kg (152 lb). Advance diet Up with therapy  Needs BM Labs and vital signs stable Acute post op blood loss anemia, hgb stable this am 10.9. Discharge to SNF pending medical clearance. Remove staples and apply steri strips on 08/17/17 Follow up with [facility] in 6 weeks Lovenox 30 mg daily x 14 days  DVT Prophylaxis - Lovenox, Foot Pumps and TED hose Weight-Bearing as tolerated to right leg   T. Rachelle Hora, PA-C [place] 08/05/2017, 8:57 AM

## 2017-08-05 NOTE — Progress Notes (Signed)
Alert and oriented but forgetful at times. Pt complained of pain this AM and requested tylenol. Pt on room air. IV saline locked. Family at bedside. No other complaints. Will continue to monitor.

## 2017-08-05 NOTE — Progress Notes (Signed)
Physical Therapy Treatment Patient Details Name: Amber Reese MRN: [id number] DOB: Jul 18, 1918 Today's Date: 08/05/2017    History of Present Illness  83 y.o. female with a known history of hypertension, hyperlipidemia, arthritis, CKD, glaucoma presents to hospital secondary to a fall and right hip fx, s/p ORIF.      PT Comments    Pt up on recliner x 2 hours this am.  Requesting to return back to bed due to fatigue.  Pt reports general decrease in pain from yesterday.  Rolling left and right with max  A x 1.  Transfers remain mostly dependant with max a x 2 with little assistance from pt but she puts forth a good effort to try.   Follow Up Recommendations  SNF     Equipment Recommendations       Recommendations for Other Services       Precautions / Restrictions Precautions Precautions: Fall Restrictions Weight Bearing Restrictions: Yes RLE Weight Bearing: Weight bearing as tolerated    Mobility  Bed Mobility Overal bed mobility: Needs Assistance Bed Mobility: Sit to Supine     Supine to sit: Max assist;+2 for physical assistance Sit to supine: Max assist;+2 for physical assistance   General bed mobility comments: Pt needed heavy assist to get laying back into bed  Transfers Overall transfer level: Needs assistance Equipment used: Rolling walker (2 wheeled);2 person hand held assist Transfers: Stand Pivot Transfers Sit to Stand: Max assist;+2 physical assistance Stand pivot transfers: Max assist;+2 physical assistance       General transfer comment: Stood with walker and stooped posture with walker, unable to move feet.  Ambulation/Gait             General Gait Details: Pt unable to take even one step, max assist sit/stand transfer to recliner   Stairs            Wheelchair Mobility    Modified Rankin (Stroke Patients Only)       Balance Overall balance assessment: Needs assistance Sitting-balance support: Feet supported Sitting  balance-Leahy Scale: Fair       Standing balance-Leahy Scale: Zero                              Cognition Arousal/Alertness: Awake/alert Behavior During Therapy: WFL for tasks assessed/performed Overall Cognitive Status: Within Functional Limits for tasks assessed                                        Exercises General Exercises - Lower Extremity Ankle Circles/Pumps: AROM;10 reps Quad Sets: AROM;10 reps Gluteal Sets: AROM;10 reps Short Arc Quad: 10 reps;AAROM;Right;Supine Heel Slides: AAROM;PROM;10 reps Hip ABduction/ADduction: AAROM;10 reps    General Comments        Pertinent Vitals/Pain Pain Assessment: 0-10 Pain Score: 4  Pain Descriptors / Indicators: Operative site guarding;Sore Pain Intervention(s): Limited activity within patient's tolerance    Home Living                      Prior Function            PT Goals (current goals can now be found in the care plan section) Progress towards PT goals: Progressing toward goals    Frequency    BID      PT Plan Current plan remains appropriate    Co-evaluation  AM-PAC PT "6 Clicks" Daily Activity  Outcome Measure  Difficulty turning over in bed (including adjusting bedclothes, sheets and blankets)?: Unable Difficulty moving from lying on back to sitting on the side of the bed? : Unable Difficulty sitting down on and standing up from a chair with arms (e.g., wheelchair, bedside commode, etc,.)?: Unable Help needed moving to and from a bed to chair (including a wheelchair)?: Total Help needed walking in hospital room?: Total Help needed climbing 3-5 steps with a railing? : Total 6 Click Score: 6    End of Session Equipment Utilized During Treatment: Gait belt Activity Tolerance: Patient limited by fatigue Patient left: in bed;with bed alarm set;with call bell/phone within reach;with nursing/sitter in room Nurse Communication: Mobility status        Time: 9147-8295 PT Time Calculation (min) (ACUTE ONLY): 10 min  Charges:  $Therapeutic Exercise: 8-22 mins $Therapeutic Activity: 8-22 mins                    G Codes:       Chesley Noon, PTA 08/05/17, 11:51 AM

## 2017-08-06 DIAGNOSIS — M189 Osteoarthritis of first carpometacarpal joint, unspecified: Secondary | ICD-10-CM | POA: Diagnosis not present

## 2017-08-06 DIAGNOSIS — H409 Unspecified glaucoma: Secondary | ICD-10-CM | POA: Diagnosis not present

## 2017-08-06 DIAGNOSIS — E039 Hypothyroidism, unspecified: Secondary | ICD-10-CM | POA: Diagnosis not present

## 2017-08-06 DIAGNOSIS — S72001D Fracture of unspecified part of neck of right femur, subsequent encounter for closed fracture with routine healing: Secondary | ICD-10-CM | POA: Diagnosis not present

## 2017-08-06 DIAGNOSIS — M810 Age-related osteoporosis without current pathological fracture: Secondary | ICD-10-CM | POA: Diagnosis not present

## 2017-08-06 DIAGNOSIS — R278 Other lack of coordination: Secondary | ICD-10-CM | POA: Diagnosis not present

## 2017-08-06 DIAGNOSIS — Z7401 Bed confinement status: Secondary | ICD-10-CM | POA: Diagnosis not present

## 2017-08-06 DIAGNOSIS — R262 Difficulty in walking, not elsewhere classified: Secondary | ICD-10-CM | POA: Diagnosis not present

## 2017-08-06 DIAGNOSIS — G3184 Mild cognitive impairment, so stated: Secondary | ICD-10-CM | POA: Diagnosis not present

## 2017-08-06 DIAGNOSIS — M6281 Muscle weakness (generalized): Secondary | ICD-10-CM | POA: Diagnosis not present

## 2017-08-06 DIAGNOSIS — Z741 Need for assistance with personal care: Secondary | ICD-10-CM | POA: Diagnosis not present

## 2017-08-06 DIAGNOSIS — S72141A Displaced intertrochanteric fracture of right femur, initial encounter for closed fracture: Secondary | ICD-10-CM | POA: Diagnosis not present

## 2017-08-06 DIAGNOSIS — F329 Major depressive disorder, single episode, unspecified: Secondary | ICD-10-CM | POA: Diagnosis not present

## 2017-08-06 DIAGNOSIS — I10 Essential (primary) hypertension: Secondary | ICD-10-CM | POA: Diagnosis not present

## 2017-08-06 DIAGNOSIS — S72001A Fracture of unspecified part of neck of right femur, initial encounter for closed fracture: Secondary | ICD-10-CM | POA: Diagnosis not present

## 2017-08-06 DIAGNOSIS — M25551 Pain in right hip: Secondary | ICD-10-CM | POA: Diagnosis not present

## 2017-08-06 DIAGNOSIS — H44519 Absolute glaucoma, unspecified eye: Secondary | ICD-10-CM | POA: Diagnosis not present

## 2017-08-06 DIAGNOSIS — F0150 Vascular dementia without behavioral disturbance: Secondary | ICD-10-CM | POA: Diagnosis not present

## 2017-08-06 DIAGNOSIS — M84359A Stress fracture, hip, unspecified, initial encounter for fracture: Secondary | ICD-10-CM | POA: Diagnosis not present

## 2017-08-06 DIAGNOSIS — F39 Unspecified mood [affective] disorder: Secondary | ICD-10-CM | POA: Diagnosis not present

## 2017-08-06 DIAGNOSIS — Z9181 History of falling: Secondary | ICD-10-CM | POA: Diagnosis not present

## 2017-08-06 LAB — BASIC METABOLIC PANEL
Anion gap: 7 (ref 5–15)
BUN: 22 mg/dL — ABNORMAL HIGH (ref 6–20)
CHLORIDE: 104 mmol/L (ref 101–111)
CO2: 25 mmol/L (ref 22–32)
CREATININE: 1.13 mg/dL — AB (ref 0.44–1.00)
Calcium: 9.2 mg/dL (ref 8.9–10.3)
GFR calc non Af Amer: 40 mL/min — ABNORMAL LOW (ref >60)
GFR, EST AFRICAN AMERICAN: 46 mL/min — AB (ref >60)
Glucose, Bld: 125 mg/dL — ABNORMAL HIGH (ref 65–99)
POTASSIUM: 3.9 mmol/L (ref 3.5–5.1)
Sodium: 136 mmol/L (ref 135–145)

## 2017-08-06 NOTE — Clinical Social Work Note (Signed)
The patient will discharge to [facility] today via non-emergent EMS. The patient, her family, and the facility are aware and in agreement. All discharge paperwork has been sent to the facility, and the packet has been delivered including the patient's DNR form. The RN will call report to [phone]. CSW will continue to follow pending additional discharge needs.  Santiago Bumpers, MSW, [facility] Presser [phone]

## 2017-08-06 NOTE — Discharge Summary (Signed)
[place] at [place] NAME: Amber Reese    MR#:  [id number]  DATE OF BIRTH:  Jun 03, 1918  DATE OF ADMISSION:  08/02/2017   ADMITTING PHYSICIAN: Gladstone Lighter, MD  DATE OF DISCHARGE: 08/06/2017  PRIMARY CARE PHYSICIAN: Kirk Ruths, MD   ADMISSION DIAGNOSIS:   Fall, initial encounter [phone]] Closed fracture of right hip, initial encounter ([place]) [S72.001A]  DISCHARGE DIAGNOSIS:   Active Problems:   Hip fracture ([place])   SECONDARY DIAGNOSIS:   Past Medical History:  Diagnosis Date  . Chronic kidney disease   . Depression   . Glaucoma   . Hyperlipidemia   . Hypertension   . Left ventricular failure ([place])   . Osteoarthritis   . Polyosteoarthritis   . Thyroid disease     HOSPITAL COURSE:   Amber Reese a 83 y.o. femalewith a known history of hypertension, hyperlipidemia, arthritis, CKD, glaucoma presents to hospital secondary to a fall and right hip pain.  #1 right femoral neck fracture-secondary to mechanical fall. - POD # 3 after ORIF, appreciate orthopedics consult - foley removed,  Physical therapy is started and patient is making good progress -Pain control with meds. -Postoperative hemoglobin is stable at this time. No indication for transfusion at this time. -to rehabilitation at discharge.  #2 hypothyroidism-continue Synthroid  #3 glaucoma-continue eyedrops  #4 DVT prophylaxis- lovenox for 2 weeks  Social worker consulted. To [facility] SNF today  DISCHARGE CONDITIONS:   Guarded  CONSULTS OBTAINED:   Treatment Team:  Corky Mull, MD  DRUG ALLERGIES:   Allergies  Allergen Reactions  . Amoxicillin    DISCHARGE MEDICATIONS:   Allergies as of 08/06/2017      Reactions   Amoxicillin       Medication List    STOP taking these medications   cephALEXin 500 MG capsule Commonly known as:  KEFLEX   tolterodine 4 MG 24 hr capsule Commonly known as:  DETROL LA     TAKE  these medications   acetaminophen 500 MG tablet Commonly known as:  TYLENOL Take 500 mg by mouth every 6 (six) hours as needed for mild pain or moderate pain.   alum & mag hydroxide-simeth 200-200-20 MG/5ML suspension Commonly known as:  MAALOX/MYLANTA Take 30 mLs by mouth every 4 (four) hours as needed for indigestion or flatulence.   aspirin EC 81 MG tablet Take 1 tablet (81 mg total) by mouth daily.   bismuth subsalicylate 417 EY/81KG suspension Commonly known as:  PEPTO BISMOL Take 10 mLs by mouth 3 (three) times daily as needed for diarrhea or loose stools.   busPIRone 7.5 MG tablet Commonly known as:  BUSPAR Take 7.5 mg by mouth 2 (two) times daily.   carbamide peroxide 6.5 % OTIC solution Commonly known as:  DEBROX Place 5 drops into both ears daily as needed. Instill 5 drops into affected ears daily for 5 days then irrigate/remove cerumen due to impaction as needed   carboxymethylcellulose 0.5 % Soln Commonly known as:  REFRESH PLUS 1 drop 4 (four) times daily as needed.   dextromethorphan-guaiFENesin 10-100 MG/5ML liquid Commonly known as:  ROBITUSSIN-DM Take 10 mLs by mouth every 4 (four) hours as needed for cough.   Difluprednate 0.05 % Emul Place 1 drop into the right eye 4 (four) times daily.   diphenhydrAMINE 25 MG tablet Commonly known as:  BENADRYL Take 25 mg by mouth every 4 (four) hours as needed for itching or allergies.   docusate  sodium 100 MG capsule Commonly known as:  COLACE Take 1 capsule (100 mg total) by mouth 2 (two) times daily.   enoxaparin 30 MG/0.3ML injection Commonly known as:  LOVENOX Inject 0.3 mLs (30 mg total) into the skin daily. X 2 weeks   latanoprost 0.005 % ophthalmic solution Commonly known as:  XALATAN Place 1 drop into the left eye at bedtime.   levothyroxine 50 MCG tablet Commonly known as:  SYNTHROID, LEVOTHROID Take 1 tablet (50 mcg total) by mouth daily. What changed:  how much to take  additional  instructions   magnesium hydroxide 400 MG/5ML suspension Commonly known as:  MILK OF MAGNESIA Take 30 mLs by mouth daily as needed for mild constipation.   nortriptyline 50 MG capsule Commonly known as:  PAMELOR Take 1 capsule (50 mg total) by mouth at bedtime.   nystatin powder Commonly known as:  MYCOSTATIN/NYSTOP Apply topically 2 (two) times daily. Apply twice daily under breasts and to groin/abdominal fold areas as needed for rash.   OCUSOFT EYELID CLEANSING EX Apply 2 application topically. Use 2 applications every Monday, Wednesday and Friday. Use 1 pad per eye.   oxyCODONE 5 MG immediate release tablet Commonly known as:  Oxy IR/ROXICODONE Take 1 tablet (5 mg total) by mouth every 4 (four) hours as needed for moderate pain, severe pain or breakthrough pain.   polyethylene glycol packet Commonly known as:  MIRALAX / GLYCOLAX Take 17 g by mouth daily.   sodium chloride 5 % ophthalmic solution Commonly known as:  MURO 128 Place 1 drop into the left eye 2 (two) times daily. 30 minutes before or after prescription drops   timolol 0.5 % ophthalmic solution Commonly known as:  BETIMOL Place 1 drop into the left eye 2 (two) times daily.   Vitamin D2 2000 units Tabs Take 2,000 Units by mouth daily.        DISCHARGE INSTRUCTIONS:   1. PCP f/u in 2-3 weeks 2. Orthopedics f/u in 10 days  DIET:   Cardiac diet  ACTIVITY:   Activity as tolerated  OXYGEN:   Home Oxygen: No.  Oxygen Delivery: room air  DISCHARGE LOCATION:   nursing home   If you experience worsening of your admission symptoms, develop shortness of breath, life threatening emergency, suicidal or homicidal thoughts you must seek medical attention immediately by calling 911 or calling your MD immediately  if symptoms less severe.  You Must read complete instructions/literature along with all the possible adverse reactions/side effects for all the Medicines you take and that have been prescribed to  you. Take any new Medicines after you have completely understood and accpet all the possible adverse reactions/side effects.   Please note  You were cared for by a hospitalist during your hospital stay. If you have any questions about your discharge medications or the care you received while you were in the hospital after you are discharged, you can call the unit and asked to speak with the hospitalist on call if the hospitalist that took care of you is not available. Once you are discharged, your primary care physician will handle any further medical issues. Please note that NO REFILLS for any discharge medications will be authorized once you are discharged, as it is imperative that you return to your primary care physician (or establish a relationship with a primary care physician if you do not have one) for your aftercare needs so that they can reassess your need for medications and monitor your lab values.  On the day of Discharge:  VITAL SIGNS:   Blood pressure (!) 154/54, pulse (!) 109, temperature 98.6 F (37 C), temperature source Oral, resp. rate 16, height 4\' 9"  (1.448 m), weight 68.9 kg (152 lb), SpO2 93 %.  PHYSICAL EXAMINATION:    GENERAL: 83 y.o.-year-old patient lying in the bed with no acute distress.  EYES: left Pupil round, reactive to light and accommodation. Right eye opacified cornea.No scleral icterus. Extraocular muscles intact.  HEENT: Head atraumatic, normocephalic. Oropharynx and nasopharynx clear.  NECK: Supple, no jugular venous distention. No thyroid enlargement, no tenderness.  LUNGS: Normal breath sounds bilaterally, no wheezing, rales,rhonchi or crepitation. No use of accessory muscles of respiration.  CARDIOVASCULAR: S1, S2 normal. No rubs, or gallops. 3/6 systolic murmur is present ABDOMEN: Soft, nontender, nondistended. Bowel sounds present. No organomegaly or mass.  EXTREMITIES: No pedal edema, cyanosis, or clubbing. Right hip pain, right hip edema at  surgical site noted. NEUROLOGIC: Cranial nerves II through XII are intact. Muscle strength 5/5 in all extremities except limited rotation of right hip due to pain. Sensation intact. Gait not checked.  PSYCHIATRIC: The patient is alert and oriented x 3. Intermittent confusion noticed that improves with re-orientation  SKIN: No obvious rash, lesion, or ulcer.   DATA REVIEW:   CBC  Recent Labs Lab 08/05/17 0338  WBC 9.9  HGB 10.9*  HCT 31.8*  PLT 161    Chemistries   Recent Labs Lab 08/06/17 0336  NA 136  K 3.9  CL 104  CO2 25  GLUCOSE 125*  BUN 22*  CREATININE 1.13*  CALCIUM 9.2     Microbiology Results  Results for orders placed or performed during the hospital encounter of 08/02/17  MRSA PCR Screening     Status: None   Collection Time: 08/02/17 10:25 PM  Result Value Ref Range Status   MRSA by PCR NEGATIVE NEGATIVE Final    Comment:        The GeneXpert MRSA Assay (FDA approved for NASAL specimens only), is one component of a comprehensive MRSA colonization surveillance program. It is not intended to diagnose MRSA infection nor to guide or monitor treatment for MRSA infections.     RADIOLOGY:  No results found.   Management plans discussed with the patient, family and they are in agreement.  CODE STATUS:     Code Status Orders        Start     Ordered   08/02/17 2200  Do not attempt resuscitation (DNR)  Continuous    Question Answer Comment  In the event of cardiac or respiratory ARREST Do not call a "code blue"   In the event of cardiac or respiratory ARREST Do not perform Intubation, CPR, defibrillation or ACLS   In the event of cardiac or respiratory ARREST Use medication by any route, position, wound care, and other measures to relive pain and suffering. May use oxygen, suction and manual treatment of airway obstruction as needed for comfort.      08/02/17 2200    Code Status History    Date Active Date Inactive Code Status Order ID  Comments User Context   This patient has a current code status but no historical code status.    Advance Directive Documentation     Most Recent Value  Type of Advance Directive  Healthcare Power of Attorney, Living will  Pre-existing out of facility DNR order (yellow form or pink MOST form)  Yellow form placed in chart (order not valid for inpatient  use)  "MOST" Form in Place?  -      TOTAL TIME TAKING CARE OF THIS PATIENT: 38 minutes.    Sussan Meter M.D on 08/06/2017 at 7:50 AM  Between 7am to 6pm - Pager - [phone]  After 6pm go to [email or web address] - [employer] [place] [facility]  Office  [phone]  CC: Primary care physician; Kirk Ruths, MD   Note: This dictation was prepared with Dragon dictation along with smaller phrase technology. Any transcriptional errors that result from this process are unintentional.

## 2017-08-06 NOTE — Progress Notes (Signed)
   Subjective: 3 Days Post-Op Procedure(s) (LRB): INTRAMEDULLARY (IM) NAIL INTERTROCHANTRIC (Right) Patient reports pain as 0 on 0-10 scale.   Patient is well, and has had no acute complaints or problems Denies any CP, SOB, ABD pain. We will continue therapy today.  Plan is to go Skilled nursing facility  today  Objective: Vital signs in last 24 hours: Temp:  [98.1 F (36.7 C)-98.6 F (37 C)] 98.6 F (37 C) (10/21 0724) Pulse Rate:  [92-109] 109 (10/21 0724) Resp:  [16-18] 16 (10/21 0724) BP: (152-170)/(52-56) 154/54 (10/21 0724) SpO2:  [93 %-96 %] 93 % (10/21 0724)  Intake/Output from previous day: 10/20 0701 - 10/21 0700 In: 720 [P.O.:720] Out: 200 [Urine:200] Intake/Output this shift: No intake/output data recorded.   Recent Labs  08/04/17 0418 08/05/17 0338  HGB 10.9* 10.9*    Recent Labs  08/04/17 0418 08/05/17 0338  WBC 9.1 9.9  RBC 3.48* 3.42*  HCT 32.9* 31.8*  PLT 155 161    Recent Labs  08/05/17 0338 08/06/17 0336  NA 140 136  K 3.7 3.9  CL 107 104  CO2 27 25  BUN 20 22*  CREATININE 1.15* 1.13*  GLUCOSE 112* 125*  CALCIUM 8.5* 9.2   No results for input(s): LABPT, INR in the last 72 hours.  EXAM General - Patient is Alert, Appropriate and Oriented Extremity - Neurovascular intact Sensation intact distally Intact pulses distally Dorsiflexion/Plantar flexion intact No cellulitis present Compartment soft Dressing - dressing C/D/I and no drainage Motor Function - intact, moving foot and toes well on exam.   Past Medical History:  Diagnosis Date  . Chronic kidney disease   . Depression   . Glaucoma   . Hyperlipidemia   . Hypertension   . Left ventricular failure ([place])   . Osteoarthritis   . Polyosteoarthritis   . Thyroid disease     Assessment/Plan:   3 Days Post-Op Procedure(s) (LRB): INTRAMEDULLARY (IM) NAIL INTERTROCHANTRIC (Right) Active Problems:   Hip fracture ([facility])  Estimated body mass index is 32.89 kg/m as  calculated from the following:   Height as of this encounter: 4\' 9"  (1.448 m).   Weight as of this encounter: 68.9 kg (152 lb). Advance diet Up with therapy  Labs and vital signs stable Acute post op blood loss anemia, hgb stable this am 10.9. Discharge to SNF today pending medical clearance. Remove staples and apply steri strips on 08/17/17 Follow up with [place] [facility] in 6 weeks Lovenox 30 mg daily x 14 days  DVT Prophylaxis - Lovenox, Foot Pumps and TED hose Weight-Bearing as tolerated to right leg   T. Rachelle Hora, PA-C [place] 08/06/2017, 8:29 AM

## 2017-08-06 NOTE — Progress Notes (Signed)
Pt remaining alert but very confused. Pt able to sleep on and off during the night. Surgical dressing remaining dry and intact. incontatanant of urine.

## 2017-08-06 NOTE — Progress Notes (Signed)
Pt alert and oriented but confused. Report called and given to [place] at [facility]. IV removed. Pt on room air. EMS here to transport pt.

## 2017-08-08 DIAGNOSIS — I10 Essential (primary) hypertension: Secondary | ICD-10-CM

## 2017-08-08 DIAGNOSIS — H44519 Absolute glaucoma, unspecified eye: Secondary | ICD-10-CM

## 2017-08-08 DIAGNOSIS — M84359A Stress fracture, hip, unspecified, initial encounter for fracture: Secondary | ICD-10-CM

## 2017-08-08 DIAGNOSIS — F39 Unspecified mood [affective] disorder: Secondary | ICD-10-CM

## 2017-08-08 DIAGNOSIS — G3184 Mild cognitive impairment, so stated: Secondary | ICD-10-CM

## 2017-08-18 DIAGNOSIS — S72141A Displaced intertrochanteric fracture of right femur, initial encounter for closed fracture: Secondary | ICD-10-CM | POA: Diagnosis not present

## 2017-08-31 DIAGNOSIS — I10 Essential (primary) hypertension: Secondary | ICD-10-CM | POA: Diagnosis not present

## 2017-08-31 DIAGNOSIS — F0150 Vascular dementia without behavioral disturbance: Secondary | ICD-10-CM | POA: Diagnosis not present

## 2017-08-31 DIAGNOSIS — S72001A Fracture of unspecified part of neck of right femur, initial encounter for closed fracture: Secondary | ICD-10-CM | POA: Diagnosis not present

## 2017-08-31 DIAGNOSIS — F39 Unspecified mood [affective] disorder: Secondary | ICD-10-CM | POA: Diagnosis not present

## 2017-09-15 DIAGNOSIS — S72141A Displaced intertrochanteric fracture of right femur, initial encounter for closed fracture: Secondary | ICD-10-CM | POA: Diagnosis not present

## 2017-09-28 DIAGNOSIS — E782 Mixed hyperlipidemia: Secondary | ICD-10-CM | POA: Diagnosis not present

## 2017-09-28 DIAGNOSIS — I119 Hypertensive heart disease without heart failure: Secondary | ICD-10-CM | POA: Diagnosis not present

## 2017-09-28 DIAGNOSIS — I129 Hypertensive chronic kidney disease with stage 1 through stage 4 chronic kidney disease, or unspecified chronic kidney disease: Secondary | ICD-10-CM | POA: Diagnosis not present

## 2017-09-28 DIAGNOSIS — N183 Chronic kidney disease, stage 3 (moderate): Secondary | ICD-10-CM | POA: Diagnosis not present

## 2017-10-02 DIAGNOSIS — E039 Hypothyroidism, unspecified: Secondary | ICD-10-CM | POA: Diagnosis not present

## 2017-10-02 DIAGNOSIS — I129 Hypertensive chronic kidney disease with stage 1 through stage 4 chronic kidney disease, or unspecified chronic kidney disease: Secondary | ICD-10-CM | POA: Diagnosis not present

## 2017-10-02 DIAGNOSIS — I10 Essential (primary) hypertension: Secondary | ICD-10-CM | POA: Diagnosis not present

## 2017-10-04 DIAGNOSIS — E785 Hyperlipidemia, unspecified: Secondary | ICD-10-CM | POA: Diagnosis not present

## 2017-10-04 DIAGNOSIS — M1990 Unspecified osteoarthritis, unspecified site: Secondary | ICD-10-CM | POA: Diagnosis not present

## 2017-10-04 DIAGNOSIS — E039 Hypothyroidism, unspecified: Secondary | ICD-10-CM | POA: Diagnosis not present

## 2017-10-04 DIAGNOSIS — S72009A Fracture of unspecified part of neck of unspecified femur, initial encounter for closed fracture: Secondary | ICD-10-CM | POA: Diagnosis not present

## 2017-10-04 DIAGNOSIS — I10 Essential (primary) hypertension: Secondary | ICD-10-CM | POA: Diagnosis not present

## 2017-10-04 DIAGNOSIS — F39 Unspecified mood [affective] disorder: Secondary | ICD-10-CM | POA: Diagnosis not present

## 2017-10-04 DIAGNOSIS — H548 Legal blindness, as defined in USA: Secondary | ICD-10-CM | POA: Diagnosis not present

## 2017-10-18 DIAGNOSIS — R262 Difficulty in walking, not elsewhere classified: Secondary | ICD-10-CM | POA: Diagnosis not present

## 2017-10-18 DIAGNOSIS — F419 Anxiety disorder, unspecified: Secondary | ICD-10-CM | POA: Diagnosis not present

## 2017-11-03 DIAGNOSIS — R262 Difficulty in walking, not elsewhere classified: Secondary | ICD-10-CM | POA: Diagnosis not present

## 2017-11-03 DIAGNOSIS — M6281 Muscle weakness (generalized): Secondary | ICD-10-CM | POA: Diagnosis not present

## 2017-11-03 DIAGNOSIS — R296 Repeated falls: Secondary | ICD-10-CM | POA: Diagnosis not present

## 2017-11-06 DIAGNOSIS — M6281 Muscle weakness (generalized): Secondary | ICD-10-CM | POA: Diagnosis not present

## 2017-11-06 DIAGNOSIS — R296 Repeated falls: Secondary | ICD-10-CM | POA: Diagnosis not present

## 2017-11-06 DIAGNOSIS — R262 Difficulty in walking, not elsewhere classified: Secondary | ICD-10-CM | POA: Diagnosis not present

## 2017-11-07 DIAGNOSIS — M6281 Muscle weakness (generalized): Secondary | ICD-10-CM | POA: Diagnosis not present

## 2017-11-07 DIAGNOSIS — R296 Repeated falls: Secondary | ICD-10-CM | POA: Diagnosis not present

## 2017-11-07 DIAGNOSIS — R262 Difficulty in walking, not elsewhere classified: Secondary | ICD-10-CM | POA: Diagnosis not present

## 2017-11-08 DIAGNOSIS — R296 Repeated falls: Secondary | ICD-10-CM | POA: Diagnosis not present

## 2017-11-08 DIAGNOSIS — M6281 Muscle weakness (generalized): Secondary | ICD-10-CM | POA: Diagnosis not present

## 2017-11-08 DIAGNOSIS — R262 Difficulty in walking, not elsewhere classified: Secondary | ICD-10-CM | POA: Diagnosis not present

## 2017-11-09 DIAGNOSIS — R262 Difficulty in walking, not elsewhere classified: Secondary | ICD-10-CM | POA: Diagnosis not present

## 2017-11-09 DIAGNOSIS — M6281 Muscle weakness (generalized): Secondary | ICD-10-CM | POA: Diagnosis not present

## 2017-11-09 DIAGNOSIS — R296 Repeated falls: Secondary | ICD-10-CM | POA: Diagnosis not present

## 2017-11-10 DIAGNOSIS — R296 Repeated falls: Secondary | ICD-10-CM | POA: Diagnosis not present

## 2017-11-10 DIAGNOSIS — R262 Difficulty in walking, not elsewhere classified: Secondary | ICD-10-CM | POA: Diagnosis not present

## 2017-11-10 DIAGNOSIS — M6281 Muscle weakness (generalized): Secondary | ICD-10-CM | POA: Diagnosis not present

## 2017-11-13 DIAGNOSIS — F39 Unspecified mood [affective] disorder: Secondary | ICD-10-CM | POA: Diagnosis not present

## 2017-11-13 DIAGNOSIS — M6281 Muscle weakness (generalized): Secondary | ICD-10-CM | POA: Diagnosis not present

## 2017-11-13 DIAGNOSIS — F0150 Vascular dementia without behavioral disturbance: Secondary | ICD-10-CM | POA: Diagnosis not present

## 2017-11-13 DIAGNOSIS — I10 Essential (primary) hypertension: Secondary | ICD-10-CM | POA: Diagnosis not present

## 2017-11-14 DIAGNOSIS — R262 Difficulty in walking, not elsewhere classified: Secondary | ICD-10-CM | POA: Diagnosis not present

## 2017-11-14 DIAGNOSIS — R296 Repeated falls: Secondary | ICD-10-CM | POA: Diagnosis not present

## 2017-11-14 DIAGNOSIS — M6281 Muscle weakness (generalized): Secondary | ICD-10-CM | POA: Diagnosis not present

## 2017-11-15 DIAGNOSIS — M6281 Muscle weakness (generalized): Secondary | ICD-10-CM | POA: Diagnosis not present

## 2017-11-15 DIAGNOSIS — R296 Repeated falls: Secondary | ICD-10-CM | POA: Diagnosis not present

## 2017-11-15 DIAGNOSIS — R262 Difficulty in walking, not elsewhere classified: Secondary | ICD-10-CM | POA: Diagnosis not present

## 2017-11-16 DIAGNOSIS — R262 Difficulty in walking, not elsewhere classified: Secondary | ICD-10-CM | POA: Diagnosis not present

## 2017-11-16 DIAGNOSIS — M6281 Muscle weakness (generalized): Secondary | ICD-10-CM | POA: Diagnosis not present

## 2017-11-16 DIAGNOSIS — R296 Repeated falls: Secondary | ICD-10-CM | POA: Diagnosis not present

## 2017-11-20 DIAGNOSIS — H409 Unspecified glaucoma: Secondary | ICD-10-CM | POA: Diagnosis not present

## 2017-11-20 DIAGNOSIS — R262 Difficulty in walking, not elsewhere classified: Secondary | ICD-10-CM | POA: Diagnosis not present

## 2017-11-20 DIAGNOSIS — M6281 Muscle weakness (generalized): Secondary | ICD-10-CM | POA: Diagnosis not present

## 2017-11-20 DIAGNOSIS — R296 Repeated falls: Secondary | ICD-10-CM | POA: Diagnosis not present

## 2017-11-21 DIAGNOSIS — M6281 Muscle weakness (generalized): Secondary | ICD-10-CM | POA: Diagnosis not present

## 2017-11-21 DIAGNOSIS — R262 Difficulty in walking, not elsewhere classified: Secondary | ICD-10-CM | POA: Diagnosis not present

## 2017-11-21 DIAGNOSIS — H409 Unspecified glaucoma: Secondary | ICD-10-CM | POA: Diagnosis not present

## 2017-11-21 DIAGNOSIS — R296 Repeated falls: Secondary | ICD-10-CM | POA: Diagnosis not present

## 2017-11-22 DIAGNOSIS — R262 Difficulty in walking, not elsewhere classified: Secondary | ICD-10-CM | POA: Diagnosis not present

## 2017-11-22 DIAGNOSIS — M6281 Muscle weakness (generalized): Secondary | ICD-10-CM | POA: Diagnosis not present

## 2017-11-22 DIAGNOSIS — R296 Repeated falls: Secondary | ICD-10-CM | POA: Diagnosis not present

## 2017-11-22 DIAGNOSIS — H409 Unspecified glaucoma: Secondary | ICD-10-CM | POA: Diagnosis not present

## 2017-11-23 DIAGNOSIS — R262 Difficulty in walking, not elsewhere classified: Secondary | ICD-10-CM | POA: Diagnosis not present

## 2017-11-23 DIAGNOSIS — M6281 Muscle weakness (generalized): Secondary | ICD-10-CM | POA: Diagnosis not present

## 2017-11-23 DIAGNOSIS — H409 Unspecified glaucoma: Secondary | ICD-10-CM | POA: Diagnosis not present

## 2017-11-23 DIAGNOSIS — R296 Repeated falls: Secondary | ICD-10-CM | POA: Diagnosis not present

## 2017-11-24 DIAGNOSIS — R262 Difficulty in walking, not elsewhere classified: Secondary | ICD-10-CM | POA: Diagnosis not present

## 2017-11-24 DIAGNOSIS — H409 Unspecified glaucoma: Secondary | ICD-10-CM | POA: Diagnosis not present

## 2017-11-24 DIAGNOSIS — R296 Repeated falls: Secondary | ICD-10-CM | POA: Diagnosis not present

## 2017-11-24 DIAGNOSIS — M6281 Muscle weakness (generalized): Secondary | ICD-10-CM | POA: Diagnosis not present

## 2017-11-27 DIAGNOSIS — H409 Unspecified glaucoma: Secondary | ICD-10-CM | POA: Diagnosis not present

## 2017-11-27 DIAGNOSIS — R262 Difficulty in walking, not elsewhere classified: Secondary | ICD-10-CM | POA: Diagnosis not present

## 2017-11-27 DIAGNOSIS — R296 Repeated falls: Secondary | ICD-10-CM | POA: Diagnosis not present

## 2017-11-27 DIAGNOSIS — M6281 Muscle weakness (generalized): Secondary | ICD-10-CM | POA: Diagnosis not present

## 2017-11-28 DIAGNOSIS — H409 Unspecified glaucoma: Secondary | ICD-10-CM | POA: Diagnosis not present

## 2017-11-28 DIAGNOSIS — R262 Difficulty in walking, not elsewhere classified: Secondary | ICD-10-CM | POA: Diagnosis not present

## 2017-11-28 DIAGNOSIS — M6281 Muscle weakness (generalized): Secondary | ICD-10-CM | POA: Diagnosis not present

## 2017-11-28 DIAGNOSIS — R296 Repeated falls: Secondary | ICD-10-CM | POA: Diagnosis not present

## 2017-11-29 DIAGNOSIS — R262 Difficulty in walking, not elsewhere classified: Secondary | ICD-10-CM | POA: Diagnosis not present

## 2017-11-29 DIAGNOSIS — M6281 Muscle weakness (generalized): Secondary | ICD-10-CM | POA: Diagnosis not present

## 2017-11-29 DIAGNOSIS — H409 Unspecified glaucoma: Secondary | ICD-10-CM | POA: Diagnosis not present

## 2017-11-29 DIAGNOSIS — R296 Repeated falls: Secondary | ICD-10-CM | POA: Diagnosis not present

## 2017-11-30 DIAGNOSIS — H409 Unspecified glaucoma: Secondary | ICD-10-CM | POA: Diagnosis not present

## 2017-11-30 DIAGNOSIS — R262 Difficulty in walking, not elsewhere classified: Secondary | ICD-10-CM | POA: Diagnosis not present

## 2017-11-30 DIAGNOSIS — M6281 Muscle weakness (generalized): Secondary | ICD-10-CM | POA: Diagnosis not present

## 2017-11-30 DIAGNOSIS — R296 Repeated falls: Secondary | ICD-10-CM | POA: Diagnosis not present

## 2017-12-01 DIAGNOSIS — R262 Difficulty in walking, not elsewhere classified: Secondary | ICD-10-CM | POA: Diagnosis not present

## 2017-12-01 DIAGNOSIS — M6281 Muscle weakness (generalized): Secondary | ICD-10-CM | POA: Diagnosis not present

## 2017-12-01 DIAGNOSIS — H409 Unspecified glaucoma: Secondary | ICD-10-CM | POA: Diagnosis not present

## 2017-12-01 DIAGNOSIS — R296 Repeated falls: Secondary | ICD-10-CM | POA: Diagnosis not present

## 2017-12-06 DIAGNOSIS — L600 Ingrowing nail: Secondary | ICD-10-CM | POA: Diagnosis not present

## 2017-12-29 DIAGNOSIS — M6281 Muscle weakness (generalized): Secondary | ICD-10-CM | POA: Diagnosis not present

## 2017-12-29 DIAGNOSIS — R293 Abnormal posture: Secondary | ICD-10-CM | POA: Diagnosis not present

## 2018-01-02 DIAGNOSIS — M6281 Muscle weakness (generalized): Secondary | ICD-10-CM | POA: Diagnosis not present

## 2018-01-02 DIAGNOSIS — R293 Abnormal posture: Secondary | ICD-10-CM | POA: Diagnosis not present

## 2018-01-03 DIAGNOSIS — R293 Abnormal posture: Secondary | ICD-10-CM | POA: Diagnosis not present

## 2018-01-03 DIAGNOSIS — M6281 Muscle weakness (generalized): Secondary | ICD-10-CM | POA: Diagnosis not present

## 2018-01-05 DIAGNOSIS — R293 Abnormal posture: Secondary | ICD-10-CM | POA: Diagnosis not present

## 2018-01-05 DIAGNOSIS — M6281 Muscle weakness (generalized): Secondary | ICD-10-CM | POA: Diagnosis not present

## 2018-01-08 DIAGNOSIS — R293 Abnormal posture: Secondary | ICD-10-CM | POA: Diagnosis not present

## 2018-01-08 DIAGNOSIS — L600 Ingrowing nail: Secondary | ICD-10-CM | POA: Diagnosis not present

## 2018-01-08 DIAGNOSIS — M6281 Muscle weakness (generalized): Secondary | ICD-10-CM | POA: Diagnosis not present

## 2018-01-10 DIAGNOSIS — H5440 Blindness, one eye, unspecified eye: Secondary | ICD-10-CM | POA: Diagnosis not present

## 2018-01-10 DIAGNOSIS — M6281 Muscle weakness (generalized): Secondary | ICD-10-CM | POA: Diagnosis not present

## 2018-01-10 DIAGNOSIS — F39 Unspecified mood [affective] disorder: Secondary | ICD-10-CM | POA: Diagnosis not present

## 2018-01-10 DIAGNOSIS — I10 Essential (primary) hypertension: Secondary | ICD-10-CM | POA: Diagnosis not present

## 2018-01-10 DIAGNOSIS — F0150 Vascular dementia without behavioral disturbance: Secondary | ICD-10-CM | POA: Diagnosis not present

## 2018-01-10 DIAGNOSIS — E039 Hypothyroidism, unspecified: Secondary | ICD-10-CM | POA: Diagnosis not present

## 2018-01-10 DIAGNOSIS — R293 Abnormal posture: Secondary | ICD-10-CM | POA: Diagnosis not present

## 2018-01-12 DIAGNOSIS — M6281 Muscle weakness (generalized): Secondary | ICD-10-CM | POA: Diagnosis not present

## 2018-01-12 DIAGNOSIS — R293 Abnormal posture: Secondary | ICD-10-CM | POA: Diagnosis not present

## 2018-01-16 DIAGNOSIS — M6281 Muscle weakness (generalized): Secondary | ICD-10-CM | POA: Diagnosis not present

## 2018-01-16 DIAGNOSIS — R293 Abnormal posture: Secondary | ICD-10-CM | POA: Diagnosis not present

## 2018-01-16 DIAGNOSIS — R296 Repeated falls: Secondary | ICD-10-CM | POA: Diagnosis not present

## 2018-01-18 DIAGNOSIS — R293 Abnormal posture: Secondary | ICD-10-CM | POA: Diagnosis not present

## 2018-01-18 DIAGNOSIS — M6281 Muscle weakness (generalized): Secondary | ICD-10-CM | POA: Diagnosis not present

## 2018-01-18 DIAGNOSIS — R296 Repeated falls: Secondary | ICD-10-CM | POA: Diagnosis not present

## 2018-01-19 DIAGNOSIS — R293 Abnormal posture: Secondary | ICD-10-CM | POA: Diagnosis not present

## 2018-01-19 DIAGNOSIS — R296 Repeated falls: Secondary | ICD-10-CM | POA: Diagnosis not present

## 2018-01-19 DIAGNOSIS — M6281 Muscle weakness (generalized): Secondary | ICD-10-CM | POA: Diagnosis not present

## 2018-03-15 DIAGNOSIS — I10 Essential (primary) hypertension: Secondary | ICD-10-CM | POA: Diagnosis not present

## 2018-03-15 DIAGNOSIS — H548 Legal blindness, as defined in USA: Secondary | ICD-10-CM | POA: Diagnosis not present

## 2018-03-15 DIAGNOSIS — F0150 Vascular dementia without behavioral disturbance: Secondary | ICD-10-CM | POA: Diagnosis not present

## 2018-03-15 DIAGNOSIS — R32 Unspecified urinary incontinence: Secondary | ICD-10-CM | POA: Diagnosis not present

## 2018-03-15 DIAGNOSIS — F39 Unspecified mood [affective] disorder: Secondary | ICD-10-CM | POA: Diagnosis not present

## 2018-04-02 IMAGING — CR DG HIP (WITH PELVIS) OPERATIVE*R*
5 series · 5 of 5 positions shown · non-contrast
Comparison: 08/02/2017

CLINICAL DATA: Right intramedullary nail.

EXAM:
OPERATIVE right HIP (WITH PELVIS IF PERFORMED) 5 VIEWS
TECHNIQUE: Fluoroscopic spot image(s) were submitted for interpretation
post-operatively.

[cont. (1 of 5)]
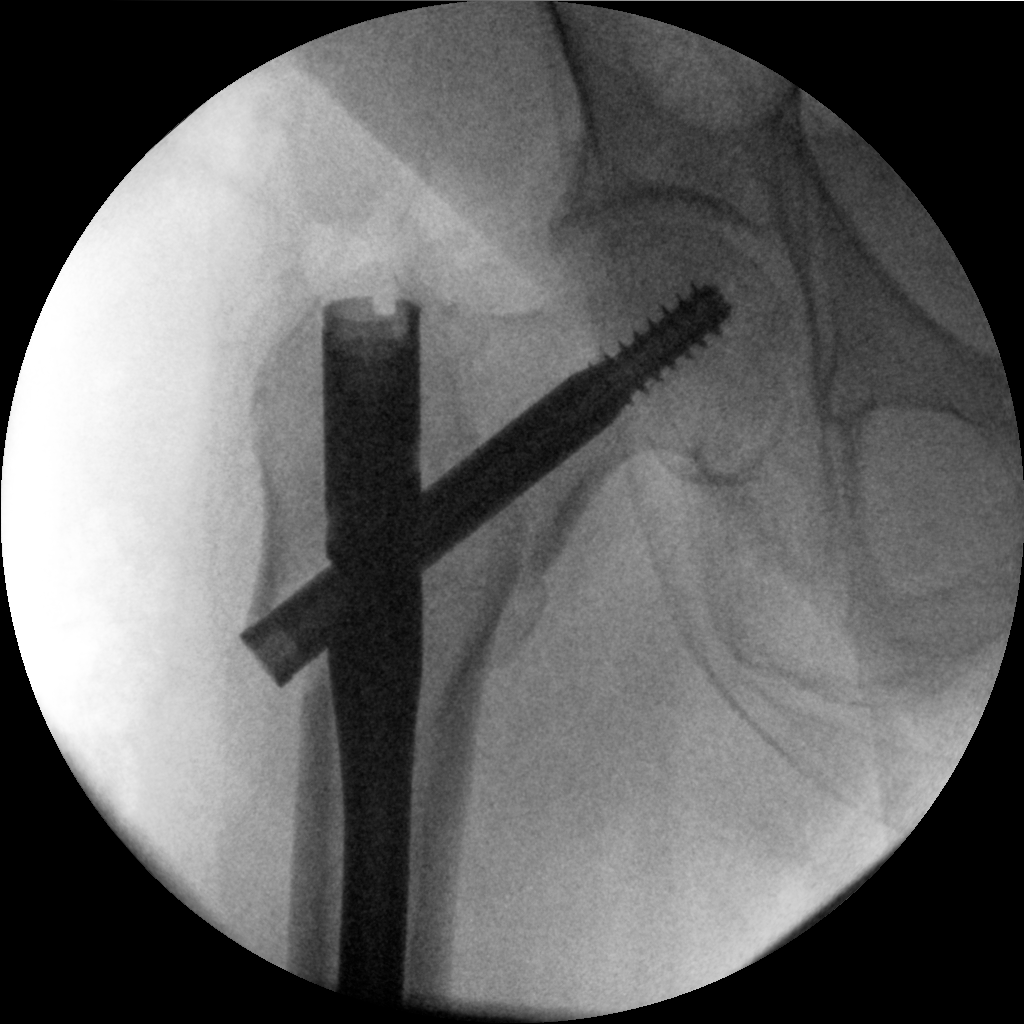

[cont. (2 of 5)]
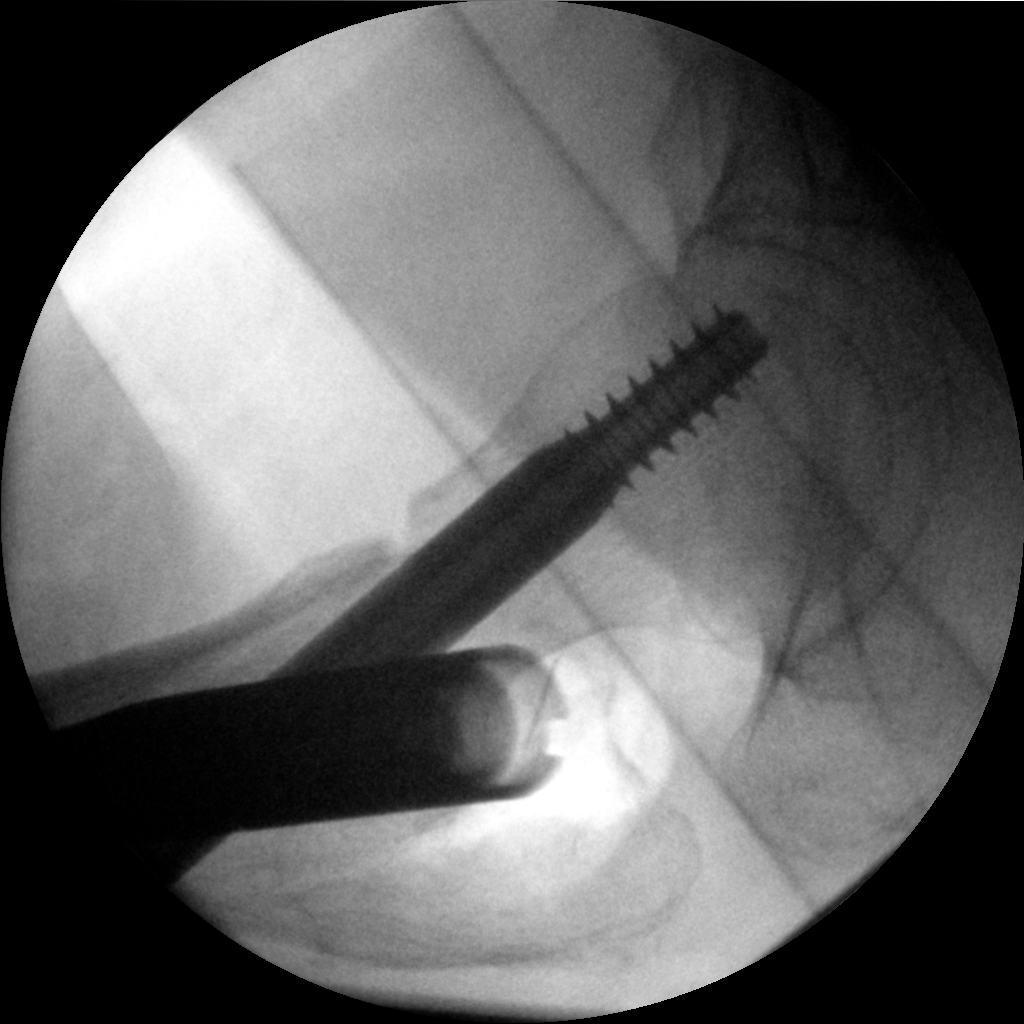

[cont. (3 of 5)]
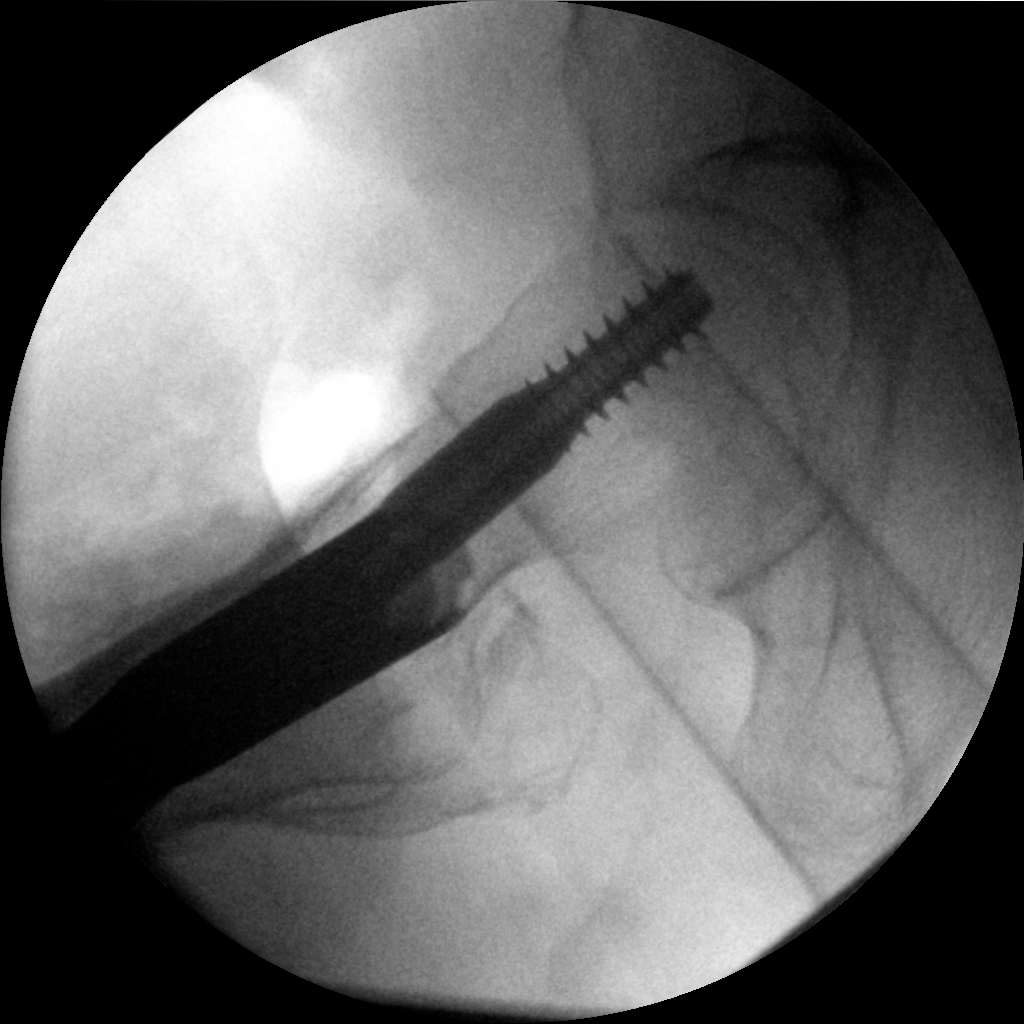

[cont. (4 of 5)]
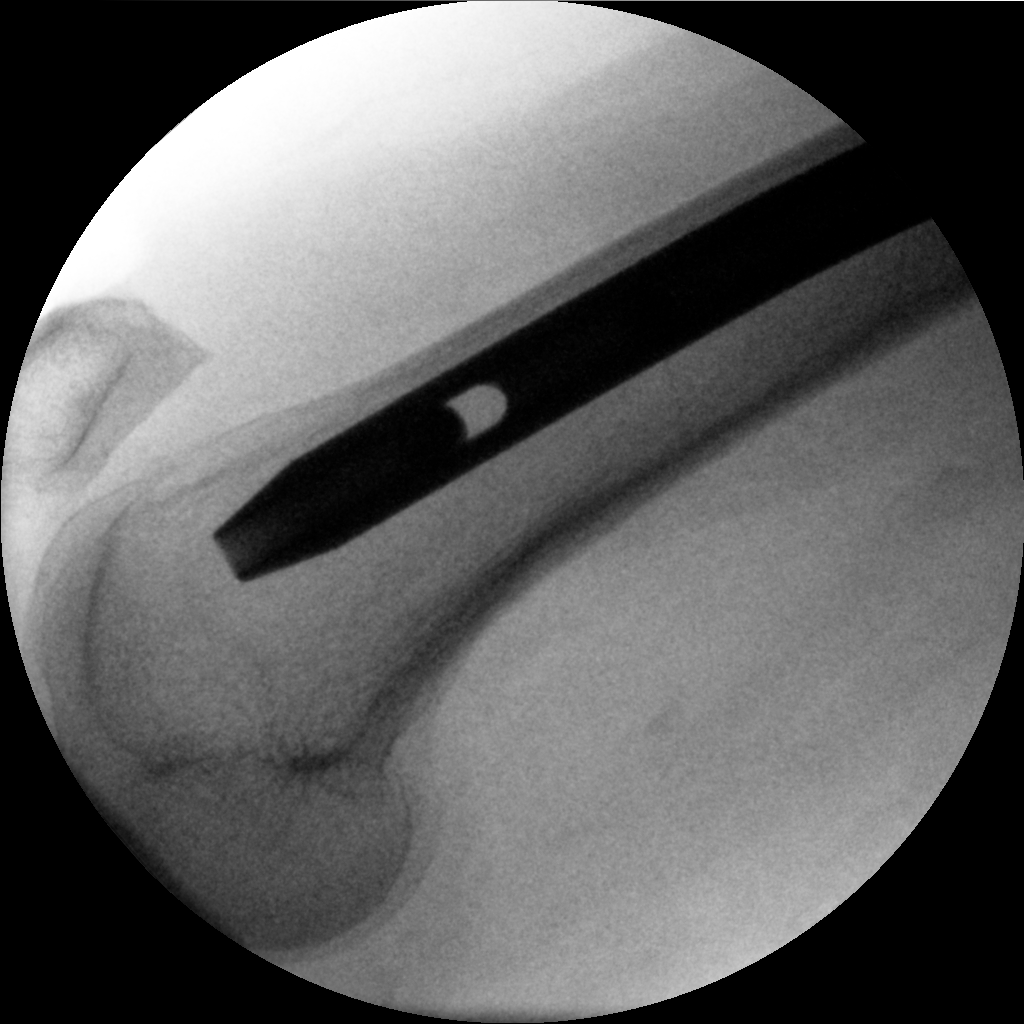

[cont. (5 of 5)]
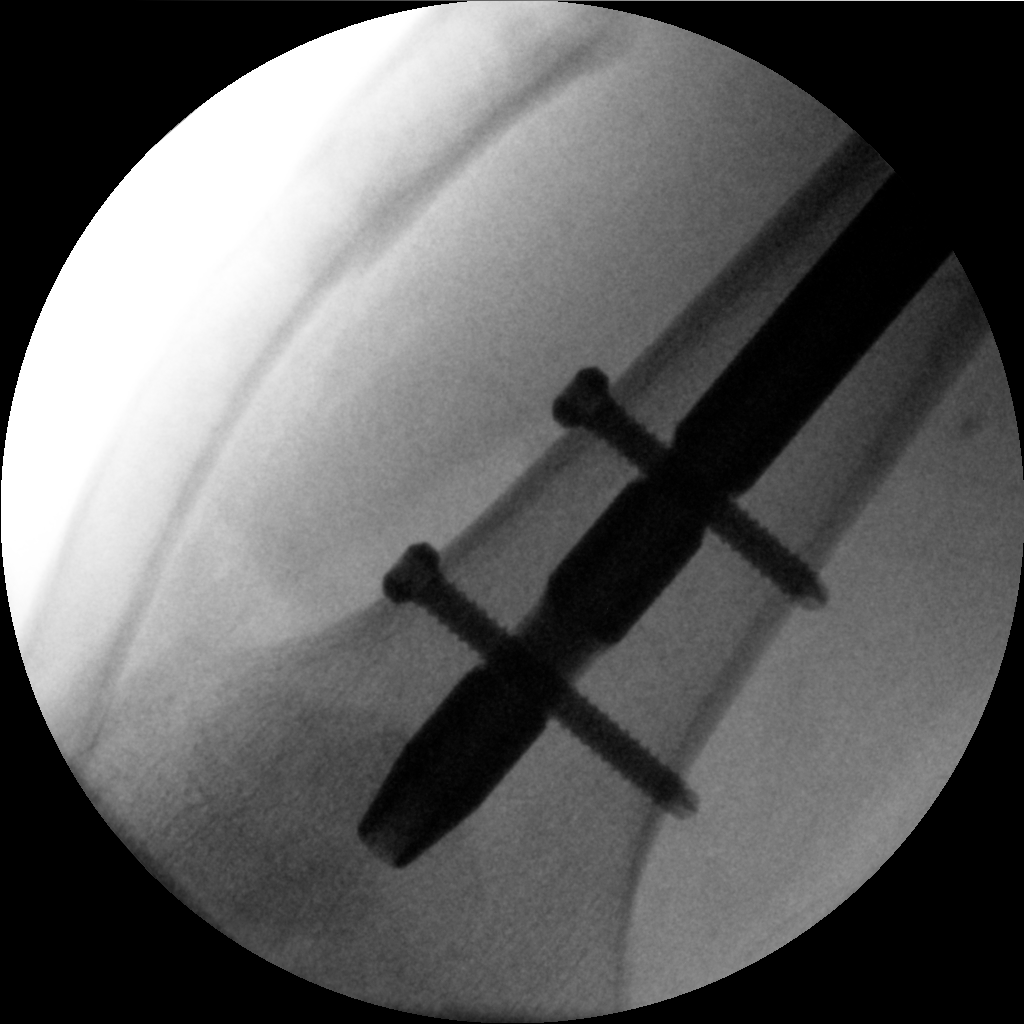

[5 of 5 positions shown; findings below may reference images not displayed]

FINDINGS: Five intraoperative spot fluoro film show placement of a long
antegrade intramedullary nail in the femur with dynamic hip screw
crossing the comminuted intertrochanteric femoral neck fracture. No
evidence for immediate hardware complications. Bony alignment is
markedly improved compared to the pre reduction film.
IMPRESSION: Status post ORIF for right femoral neck fracture without
complicating features.

## 2018-05-03 ENCOUNTER — Ambulatory Visit (INDEPENDENT_AMBULATORY_CARE_PROVIDER_SITE_OTHER): Payer: Medicare Other | Admitting: Family Medicine

## 2018-05-03 ENCOUNTER — Encounter: Payer: Self-pay | Admitting: Family Medicine

## 2018-05-03 VITALS — BP 114/68 | HR 90 | Temp 98.4°F | Ht 59.0 in | Wt 130.2 lb

## 2018-05-03 DIAGNOSIS — S72001S Fracture of unspecified part of neck of right femur, sequela: Secondary | ICD-10-CM | POA: Diagnosis not present

## 2018-05-03 DIAGNOSIS — M2170 Unequal limb length (acquired), unspecified site: Secondary | ICD-10-CM

## 2018-05-03 NOTE — Progress Notes (Signed)
Dr. Frederico Hamman T. Sharetha Newson, MD, [facility] [place], [address] Phone: [phone] Fax: [phone]  05/03/2018  Patient: Amber Reese, MRN: [id number], DOB: 29-Jun-1918, 84 y.o.  Primary Physician:  Kirk Ruths, MD   Chief Complaint  Patient presents with  . Leg Pain    right, has a steel rod in it   Subjective:   Amber Reese is a 84 y.o. very pleasant female patient who presents with the following:  Intertrochanteric fracture, Dr. Roland Rack.  08/02/2018  The patient is a 84 year old patient who is a poor historian, she also is mostly blind, and is accompanied today by a aide.  She has no significant family that lives close.  On chart review, it appears that the patient sustained a three-part  intertrochanteric right hip fracture, and surgery was done on August 03, 2017.  Since that time she has been in a wheelchair.  According to her and aide, she is doing some physical therapy where the therapist tries to walk with her with a wheelchair behind her.  Generally speaking, she needs help and assistance at all times and is grossly in a wheelchair.  She is unable to get out of the wheelchair today, and she is unable to get to the examination table. Now in a wheelchair for since 07/2017.  Not able to walk at all.   Now seeing Dr. Silvio Pate.  1 1/2 inch or greater.   Past Medical History, Surgical History, Social History, Family History, Problem List, Medications, and Allergies have been reviewed and updated if relevant.  Patient Active Problem List   Diagnosis Date Noted  . Hip fracture ([place]) 08/02/2017    Past Medical History:  Diagnosis Date  . Chronic kidney disease   . Depression   . Glaucoma   . Hyperlipidemia   . Hypertension   . Left ventricular failure ([place])   . Osteoarthritis   . Polyosteoarthritis   . Thyroid disease     Past Surgical History:  Procedure Laterality Date  . CORNEAL TRANSPLANT      . FRACTURE SURGERY Right   . INTRAMEDULLARY (IM) NAIL INTERTROCHANTERIC Right 08/03/2017   Procedure: INTRAMEDULLARY (IM) NAIL INTERTROCHANTRIC;  Surgeon: Corky Mull, MD;  Location: [facility] ORS;  Service: Orthopedics;  Laterality: Right;    Social History   Socioeconomic History  . Marital status: Widowed    Spouse name: Not on file  . Number of children: Not on file  . Years of education: Not on file  . Highest education level: Not on file  Occupational History  . Not on file  Social Needs  . Financial resource strain: Not on file  . Food insecurity:    Worry: Not on file    Inability: Not on file  . Transportation needs:    Medical: Not on file    Non-medical: Not on file  Tobacco Use  . Smoking status: Never Smoker  . Smokeless tobacco: Never Used  Substance and Sexual Activity  . Alcohol use: No    Alcohol/week: 0.0 oz  . Drug use: No  . Sexual activity: Not on file  Lifestyle  . Physical activity:    Days per week: Not on file    Minutes per session: Not on file  . Stress: Not on file  Relationships  . Social connections:    Talks on phone: Not on file    Gets together: Not on file    Attends religious service:  Not on file    Active member of club or organization: Not on file    Attends meetings of clubs or organizations: Not on file    Relationship status: Not on file  . Intimate partner violence:    Fear of current or ex partner: Not on file    Emotionally abused: Not on file    Physically abused: Not on file    Forced sexual activity: Not on file  Other Topics Concern  . Not on file  Social History Narrative  . Not on file    No family history on file.  Allergies  Allergen Reactions  . Amoxicillin     Medication list reviewed and updated in full in [place].   GEN: No acute illnesses, no fevers, chills. GI: No n/v/d, eating normally Pulm: No SOB Interactive and getting along well at home.  Otherwise, ROS is as per the  HPI.  Objective:   BP 114/68   Pulse 90   Temp 98.4 F (36.9 C) (Oral)   Ht 4\' 11"  (1.499 m)   Wt 130 lb 4 oz (59.1 kg)   BMI 26.31 kg/m   GEN: WDWN, NAD, Non-toxic, A & O x 3 HEENT: Atraumatic, Normocephalic. Neck supple. No masses, No LAD. Ears and Nose: No external deformity. EXTR: No c/c/e NEURO in wheelchair PSYCH: Normally interactive. Conversant. Not depressed or anxious appearing.  Calm demeanor.   The patient is extremely difficult to examine.  I was unable to get her up on the examination table and was unable to complete a appropriate evaluation of leg length from her ASIS to her ankle.  Nevertheless with measuring in general, she has at the very minimum a 30 mm leg length discrepancy and shortening of her right femur.  Laboratory and Imaging Data:  Assessment and Plan:   Acquired unequal leg length on right  Closed fracture of right hip, sequela  >45 minutes spent in face to face time with patient, >50% spent in counselling or coordination of care: Additional time spent in multiple explanations to the patient.  I am unclear if she has significant hearing loss or there was lack of understanding or if she has baseline dementia.  This is an extremely difficult case, with a 84 year old who sustained a quite traumatic hip fracture with a significant loss of leg length at the thigh after surgery.  She has essentially been wheelchair-bound since that time.  Potential for recovery is guarded.  There was some misunderstanding I think in her coming to our office, and she seemed to think that I sold shoes.  A custom crafted a 9 mm insole lift, which may help to some degree.  Her leg length discrepancy is so great that I think that she will need to have a custom outsole correction if it is to help at all.  She is now essentially not able to walk at all, so it may ultimately be a moot point.  I gave her a prescription to go to [employer] to have some custom outsole correction made.   Hopefully this will make a difference.  Ultimately any improvement is a win in this case.  Follow-up: I will sign off on this case.   Signed,  Maud Deed. Amber Roseman, MD   Allergies as of 05/03/2018      Reactions   Amoxicillin       Medication List        Accurate as of 05/03/18  6:57 PM. Always use your most recent  med list.          acetaminophen 500 MG tablet Commonly known as:  TYLENOL Take 500 mg by mouth every 6 (six) hours as needed for mild pain or moderate pain.   alum & mag hydroxide-simeth 200-200-20 MG/5ML suspension Commonly known as:  MAALOX/MYLANTA Take 30 mLs by mouth every 4 (four) hours as needed for indigestion or flatulence.   aspirin EC 81 MG tablet Take 1 tablet (81 mg total) by mouth daily.   bismuth subsalicylate 569 VX/48AX suspension Commonly known as:  PEPTO BISMOL Take 10 mLs by mouth 3 (three) times daily as needed for diarrhea or loose stools.   busPIRone 7.5 MG tablet Commonly known as:  BUSPAR Take 7.5 mg by mouth 2 (two) times daily.   carbamide peroxide 6.5 % OTIC solution Commonly known as:  DEBROX Place 5 drops into both ears daily as needed. Instill 5 drops into affected ears daily for 5 days then irrigate/remove cerumen due to impaction as needed   carboxymethylcellulose 0.5 % Soln Commonly known as:  REFRESH PLUS 1 drop 4 (four) times daily as needed.   dextromethorphan-guaiFENesin 10-100 MG/5ML liquid Commonly known as:  ROBITUSSIN-DM Take 10 mLs by mouth every 4 (four) hours as needed for cough.   Difluprednate 0.05 % Emul Place 1 drop into the right eye 4 (four) times daily.   diphenhydrAMINE 25 MG tablet Commonly known as:  BENADRYL Take 25 mg by mouth every 4 (four) hours as needed for itching or allergies.   docusate sodium 100 MG capsule Commonly known as:  COLACE Take 1 capsule (100 mg total) by mouth 2 (two) times daily.   latanoprost 0.005 % ophthalmic solution Commonly known as:  XALATAN Place 1 drop into  the left eye at bedtime.   levothyroxine 50 MCG tablet Commonly known as:  SYNTHROID, LEVOTHROID Take 1 tablet (50 mcg total) by mouth daily.   losartan 25 MG tablet Commonly known as:  COZAAR Take 25 mg by mouth daily.   magnesium hydroxide 400 MG/5ML suspension Commonly known as:  MILK OF MAGNESIA Take 30 mLs by mouth daily as needed for mild constipation.   Melatonin 3 MG Caps Take by mouth at bedtime.   nortriptyline 50 MG capsule Commonly known as:  PAMELOR Take 1 capsule (50 mg total) by mouth at bedtime.   nystatin powder Commonly known as:  MYCOSTATIN/NYSTOP Apply topically 2 (two) times daily. Apply twice daily under breasts and to groin/abdominal fold areas as needed for rash.   OCUSOFT EYELID CLEANSING EX Apply 2 application topically. Use 2 applications every Monday, Wednesday and Friday. Use 1 pad per eye.   polyethylene glycol packet Commonly known as:  MIRALAX / GLYCOLAX Take 17 g by mouth daily.   sodium chloride 5 % ophthalmic solution Commonly known as:  MURO 128 Place 1 drop into the left eye 2 (two) times daily. 30 minutes before or after prescription drops   timolol 0.5 % ophthalmic solution Commonly known as:  BETIMOL Place 1 drop into the left eye 2 (two) times daily.   Vitamin D2 2000 units Tabs Take 2,000 Units by mouth daily.

## 2018-05-03 NOTE — Patient Instructions (Signed)
  Tennis Shoes / Running Shoes: Many good companies and styles. The most important thing is to get a good fit and wear a shoe that feels good in the store. Walk around in the store. Run if that is something that you can do. Some of the running stores have a treadmill, also.  [employer], [employer], [employer] [place] are good, but the most important thing is to wear a shoe that fits your foot and feels good.  [employer] in [place]: excellent staff, IT consultant (Running and [employer]), OTC orthotics. On [address] across from the [employer]. For running shoe and athletic shoe fit, they are the best.  Nicer shoes:  [place] shoes, [place]: Excellent selection  [employer], [place], : They have the largest selection of comfort and supportive shoes that I have ever seen, and their staff is generally quite good in fitting you for shoes. (They will intermittently have sales, mail coupons, and you can look on their website.)  [employer]: [address], [place], [place]

## 2018-05-09 ENCOUNTER — Telehealth: Payer: Self-pay | Admitting: Family Medicine

## 2018-05-09 DIAGNOSIS — F0150 Vascular dementia without behavioral disturbance: Secondary | ICD-10-CM | POA: Diagnosis not present

## 2018-05-09 DIAGNOSIS — H542X11 Low vision right eye category 1, low vision left eye category 1: Secondary | ICD-10-CM | POA: Diagnosis not present

## 2018-05-09 DIAGNOSIS — I10 Essential (primary) hypertension: Secondary | ICD-10-CM | POA: Diagnosis not present

## 2018-05-09 DIAGNOSIS — F39 Unspecified mood [affective] disorder: Secondary | ICD-10-CM | POA: Diagnosis not present

## 2018-05-09 DIAGNOSIS — E039 Hypothyroidism, unspecified: Secondary | ICD-10-CM | POA: Diagnosis not present

## 2018-05-09 DIAGNOSIS — R3981 Functional urinary incontinence: Secondary | ICD-10-CM | POA: Diagnosis not present

## 2018-05-09 NOTE — Telephone Encounter (Signed)
Copied from [place] [id number]. Topic: Quick Communication - See Telephone Encounter >> May 09, 2018 11:07 AM Ivar Drape wrote: CRM for notification. See Telephone encounter for: 05/09/18. Patient stated that she was in the office on 05/03/18 for an evaluation.  She said a prescription was sent over for show lifts but she was under the impression that the prescription would be for new shoes.  She is at [employer] now.  Ph: [phone]  Fax: [phone]

## 2018-05-09 NOTE — Telephone Encounter (Signed)
Colletta Maryland at [employer] as instructed by telephone and verbalized understanding. Colletta Maryland stated that she thinks the patient is confused and will explain all of this to the patient.

## 2018-05-09 NOTE — Telephone Encounter (Signed)
30 minute face to face conversation about this with patient.  I sent her with a script for a outsole leg length correction to be made on multiple shoes. I gave her a script for such.  This is not a shoe store but a custom prosthetic shop - we went over multiple times.   My suspicion is that she did not completely remember our conversation.   There is no need for her to follow-up with me again.

## 2018-07-05 DIAGNOSIS — F0150 Vascular dementia without behavioral disturbance: Secondary | ICD-10-CM | POA: Diagnosis not present

## 2018-07-05 DIAGNOSIS — I10 Essential (primary) hypertension: Secondary | ICD-10-CM | POA: Diagnosis not present

## 2018-07-05 DIAGNOSIS — H548 Legal blindness, as defined in USA: Secondary | ICD-10-CM | POA: Diagnosis not present

## 2018-07-05 DIAGNOSIS — F39 Unspecified mood [affective] disorder: Secondary | ICD-10-CM

## 2018-07-05 DIAGNOSIS — R3981 Functional urinary incontinence: Secondary | ICD-10-CM | POA: Diagnosis not present

## 2018-08-02 DIAGNOSIS — R319 Hematuria, unspecified: Secondary | ICD-10-CM | POA: Diagnosis not present

## 2018-08-28 DIAGNOSIS — S0511XA Contusion of eyeball and orbital tissues, right eye, initial encounter: Secondary | ICD-10-CM | POA: Diagnosis not present

## 2018-09-05 DIAGNOSIS — F39 Unspecified mood [affective] disorder: Secondary | ICD-10-CM

## 2018-09-05 DIAGNOSIS — H547 Unspecified visual loss: Secondary | ICD-10-CM | POA: Diagnosis not present

## 2018-09-05 DIAGNOSIS — F0150 Vascular dementia without behavioral disturbance: Secondary | ICD-10-CM

## 2018-09-05 DIAGNOSIS — R32 Unspecified urinary incontinence: Secondary | ICD-10-CM

## 2018-09-05 DIAGNOSIS — I10 Essential (primary) hypertension: Secondary | ICD-10-CM

## 2018-09-05 DIAGNOSIS — E039 Hypothyroidism, unspecified: Secondary | ICD-10-CM

## 2018-11-15 DIAGNOSIS — I10 Essential (primary) hypertension: Secondary | ICD-10-CM | POA: Diagnosis not present

## 2018-11-15 DIAGNOSIS — H540X33 Blindness right eye category 3, blindness left eye category 3: Secondary | ICD-10-CM | POA: Diagnosis not present

## 2018-11-15 DIAGNOSIS — N3942 Incontinence without sensory awareness: Secondary | ICD-10-CM | POA: Diagnosis not present

## 2018-11-15 DIAGNOSIS — F0150 Vascular dementia without behavioral disturbance: Secondary | ICD-10-CM | POA: Diagnosis not present

## 2018-11-15 DIAGNOSIS — F39 Unspecified mood [affective] disorder: Secondary | ICD-10-CM | POA: Diagnosis not present

## 2018-12-21 DIAGNOSIS — K06010 Localized gingival recession, unspecified: Secondary | ICD-10-CM | POA: Diagnosis not present

## 2019-01-04 DIAGNOSIS — N390 Urinary tract infection, site not specified: Secondary | ICD-10-CM

## 2019-01-04 DIAGNOSIS — F39 Unspecified mood [affective] disorder: Secondary | ICD-10-CM | POA: Diagnosis not present

## 2019-01-04 DIAGNOSIS — F0150 Vascular dementia without behavioral disturbance: Secondary | ICD-10-CM | POA: Diagnosis not present

## 2019-01-04 DIAGNOSIS — I10 Essential (primary) hypertension: Secondary | ICD-10-CM | POA: Diagnosis not present

## 2019-01-04 DIAGNOSIS — H540X33 Blindness right eye category 3, blindness left eye category 3: Secondary | ICD-10-CM

## 2019-01-04 DIAGNOSIS — E039 Hypothyroidism, unspecified: Secondary | ICD-10-CM | POA: Diagnosis not present

## 2019-03-05 DIAGNOSIS — F0150 Vascular dementia without behavioral disturbance: Secondary | ICD-10-CM | POA: Diagnosis not present

## 2019-03-15 DIAGNOSIS — H548 Legal blindness, as defined in USA: Secondary | ICD-10-CM | POA: Diagnosis not present

## 2019-03-15 DIAGNOSIS — R3981 Functional urinary incontinence: Secondary | ICD-10-CM | POA: Diagnosis not present

## 2019-03-15 DIAGNOSIS — F0150 Vascular dementia without behavioral disturbance: Secondary | ICD-10-CM | POA: Diagnosis not present

## 2019-03-15 DIAGNOSIS — F39 Unspecified mood [affective] disorder: Secondary | ICD-10-CM | POA: Diagnosis not present

## 2019-03-15 DIAGNOSIS — I10 Essential (primary) hypertension: Secondary | ICD-10-CM | POA: Diagnosis not present

## 2019-04-18 DIAGNOSIS — L03011 Cellulitis of right finger: Secondary | ICD-10-CM | POA: Diagnosis not present

## 2019-05-02 DIAGNOSIS — L03011 Cellulitis of right finger: Secondary | ICD-10-CM | POA: Diagnosis not present

## 2019-05-08 DIAGNOSIS — F39 Unspecified mood [affective] disorder: Secondary | ICD-10-CM | POA: Diagnosis not present

## 2019-05-08 DIAGNOSIS — H547 Unspecified visual loss: Secondary | ICD-10-CM

## 2019-05-08 DIAGNOSIS — M1990 Unspecified osteoarthritis, unspecified site: Secondary | ICD-10-CM

## 2019-05-08 DIAGNOSIS — R3981 Functional urinary incontinence: Secondary | ICD-10-CM

## 2019-05-08 DIAGNOSIS — E039 Hypothyroidism, unspecified: Secondary | ICD-10-CM | POA: Diagnosis not present

## 2019-05-08 DIAGNOSIS — I10 Essential (primary) hypertension: Secondary | ICD-10-CM | POA: Diagnosis not present

## 2019-05-08 DIAGNOSIS — F0150 Vascular dementia without behavioral disturbance: Secondary | ICD-10-CM | POA: Diagnosis not present

## 2019-05-14 DIAGNOSIS — B342 Coronavirus infection, unspecified: Secondary | ICD-10-CM | POA: Diagnosis not present

## 2019-06-21 DIAGNOSIS — Z03818 Encounter for observation for suspected exposure to other biological agents ruled out: Secondary | ICD-10-CM | POA: Diagnosis not present

## 2019-06-26 DIAGNOSIS — Z03818 Encounter for observation for suspected exposure to other biological agents ruled out: Secondary | ICD-10-CM | POA: Diagnosis not present

## 2019-07-01 DIAGNOSIS — I10 Essential (primary) hypertension: Secondary | ICD-10-CM | POA: Diagnosis not present

## 2019-07-01 DIAGNOSIS — E039 Hypothyroidism, unspecified: Secondary | ICD-10-CM | POA: Diagnosis not present

## 2019-07-02 DIAGNOSIS — I10 Essential (primary) hypertension: Secondary | ICD-10-CM

## 2019-07-02 DIAGNOSIS — F39 Unspecified mood [affective] disorder: Secondary | ICD-10-CM | POA: Diagnosis not present

## 2019-07-02 DIAGNOSIS — N183 Chronic kidney disease, stage 3 (moderate): Secondary | ICD-10-CM | POA: Diagnosis not present

## 2019-07-02 DIAGNOSIS — F0150 Vascular dementia without behavioral disturbance: Secondary | ICD-10-CM | POA: Diagnosis not present

## 2019-07-02 DIAGNOSIS — H547 Unspecified visual loss: Secondary | ICD-10-CM | POA: Diagnosis not present

## 2019-07-30 DIAGNOSIS — Z03818 Encounter for observation for suspected exposure to other biological agents ruled out: Secondary | ICD-10-CM | POA: Diagnosis not present

## 2019-08-02 DIAGNOSIS — Z03818 Encounter for observation for suspected exposure to other biological agents ruled out: Secondary | ICD-10-CM | POA: Diagnosis not present

## 2019-08-06 DIAGNOSIS — Z03818 Encounter for observation for suspected exposure to other biological agents ruled out: Secondary | ICD-10-CM | POA: Diagnosis not present

## 2019-08-12 DIAGNOSIS — B351 Tinea unguium: Secondary | ICD-10-CM | POA: Diagnosis not present

## 2019-08-16 DIAGNOSIS — Z03818 Encounter for observation for suspected exposure to other biological agents ruled out: Secondary | ICD-10-CM | POA: Diagnosis not present

## 2019-08-20 DIAGNOSIS — Z03818 Encounter for observation for suspected exposure to other biological agents ruled out: Secondary | ICD-10-CM | POA: Diagnosis not present

## 2019-08-30 DIAGNOSIS — H547 Unspecified visual loss: Secondary | ICD-10-CM | POA: Diagnosis not present

## 2019-08-30 DIAGNOSIS — M1990 Unspecified osteoarthritis, unspecified site: Secondary | ICD-10-CM

## 2019-08-30 DIAGNOSIS — I10 Essential (primary) hypertension: Secondary | ICD-10-CM | POA: Diagnosis not present

## 2019-08-30 DIAGNOSIS — E035 Myxedema coma: Secondary | ICD-10-CM | POA: Diagnosis not present

## 2019-08-30 DIAGNOSIS — N183 Chronic kidney disease, stage 3 unspecified: Secondary | ICD-10-CM | POA: Diagnosis not present

## 2019-08-30 DIAGNOSIS — F0150 Vascular dementia without behavioral disturbance: Secondary | ICD-10-CM | POA: Diagnosis not present

## 2019-08-30 DIAGNOSIS — F39 Unspecified mood [affective] disorder: Secondary | ICD-10-CM | POA: Diagnosis not present

## 2019-08-30 DIAGNOSIS — R32 Unspecified urinary incontinence: Secondary | ICD-10-CM | POA: Diagnosis not present

## 2019-09-09 DIAGNOSIS — I129 Hypertensive chronic kidney disease with stage 1 through stage 4 chronic kidney disease, or unspecified chronic kidney disease: Secondary | ICD-10-CM | POA: Diagnosis not present

## 2019-09-09 DIAGNOSIS — N189 Chronic kidney disease, unspecified: Secondary | ICD-10-CM | POA: Diagnosis not present

## 2019-10-28 DIAGNOSIS — I10 Essential (primary) hypertension: Secondary | ICD-10-CM | POA: Diagnosis not present

## 2019-10-28 DIAGNOSIS — F0150 Vascular dementia without behavioral disturbance: Secondary | ICD-10-CM | POA: Diagnosis not present

## 2019-10-28 DIAGNOSIS — H547 Unspecified visual loss: Secondary | ICD-10-CM | POA: Diagnosis not present

## 2019-10-28 DIAGNOSIS — F39 Unspecified mood [affective] disorder: Secondary | ICD-10-CM | POA: Diagnosis not present

## 2019-10-28 DIAGNOSIS — Z23 Encounter for immunization: Secondary | ICD-10-CM | POA: Diagnosis not present

## 2019-10-28 DIAGNOSIS — N1832 Chronic kidney disease, stage 3b: Secondary | ICD-10-CM | POA: Diagnosis not present

## 2019-10-31 DIAGNOSIS — R262 Difficulty in walking, not elsewhere classified: Secondary | ICD-10-CM | POA: Diagnosis not present

## 2019-10-31 DIAGNOSIS — F0150 Vascular dementia without behavioral disturbance: Secondary | ICD-10-CM | POA: Diagnosis not present

## 2019-10-31 DIAGNOSIS — M1990 Unspecified osteoarthritis, unspecified site: Secondary | ICD-10-CM | POA: Diagnosis not present

## 2019-10-31 DIAGNOSIS — I10 Essential (primary) hypertension: Secondary | ICD-10-CM | POA: Diagnosis not present

## 2019-11-01 DIAGNOSIS — F0150 Vascular dementia without behavioral disturbance: Secondary | ICD-10-CM | POA: Diagnosis not present

## 2019-11-01 DIAGNOSIS — I10 Essential (primary) hypertension: Secondary | ICD-10-CM | POA: Diagnosis not present

## 2019-11-01 DIAGNOSIS — R262 Difficulty in walking, not elsewhere classified: Secondary | ICD-10-CM | POA: Diagnosis not present

## 2019-11-01 DIAGNOSIS — M1990 Unspecified osteoarthritis, unspecified site: Secondary | ICD-10-CM | POA: Diagnosis not present

## 2019-11-04 DIAGNOSIS — I10 Essential (primary) hypertension: Secondary | ICD-10-CM | POA: Diagnosis not present

## 2019-11-04 DIAGNOSIS — N390 Urinary tract infection, site not specified: Secondary | ICD-10-CM | POA: Diagnosis not present

## 2019-11-04 DIAGNOSIS — M1990 Unspecified osteoarthritis, unspecified site: Secondary | ICD-10-CM | POA: Diagnosis not present

## 2019-11-04 DIAGNOSIS — R262 Difficulty in walking, not elsewhere classified: Secondary | ICD-10-CM | POA: Diagnosis not present

## 2019-11-04 DIAGNOSIS — F0150 Vascular dementia without behavioral disturbance: Secondary | ICD-10-CM | POA: Diagnosis not present

## 2019-11-04 DIAGNOSIS — F05 Delirium due to known physiological condition: Secondary | ICD-10-CM | POA: Diagnosis not present

## 2019-11-05 DIAGNOSIS — M1990 Unspecified osteoarthritis, unspecified site: Secondary | ICD-10-CM | POA: Diagnosis not present

## 2019-11-05 DIAGNOSIS — F0150 Vascular dementia without behavioral disturbance: Secondary | ICD-10-CM | POA: Diagnosis not present

## 2019-11-05 DIAGNOSIS — I10 Essential (primary) hypertension: Secondary | ICD-10-CM | POA: Diagnosis not present

## 2019-11-05 DIAGNOSIS — R262 Difficulty in walking, not elsewhere classified: Secondary | ICD-10-CM | POA: Diagnosis not present

## 2019-11-07 DIAGNOSIS — M1990 Unspecified osteoarthritis, unspecified site: Secondary | ICD-10-CM | POA: Diagnosis not present

## 2019-11-07 DIAGNOSIS — R262 Difficulty in walking, not elsewhere classified: Secondary | ICD-10-CM | POA: Diagnosis not present

## 2019-11-07 DIAGNOSIS — F0150 Vascular dementia without behavioral disturbance: Secondary | ICD-10-CM | POA: Diagnosis not present

## 2019-11-07 DIAGNOSIS — I10 Essential (primary) hypertension: Secondary | ICD-10-CM | POA: Diagnosis not present

## 2019-11-08 DIAGNOSIS — R262 Difficulty in walking, not elsewhere classified: Secondary | ICD-10-CM | POA: Diagnosis not present

## 2019-11-08 DIAGNOSIS — I10 Essential (primary) hypertension: Secondary | ICD-10-CM | POA: Diagnosis not present

## 2019-11-08 DIAGNOSIS — M1990 Unspecified osteoarthritis, unspecified site: Secondary | ICD-10-CM | POA: Diagnosis not present

## 2019-11-08 DIAGNOSIS — F0150 Vascular dementia without behavioral disturbance: Secondary | ICD-10-CM | POA: Diagnosis not present

## 2019-11-13 DIAGNOSIS — I10 Essential (primary) hypertension: Secondary | ICD-10-CM | POA: Diagnosis not present

## 2019-11-13 DIAGNOSIS — F0150 Vascular dementia without behavioral disturbance: Secondary | ICD-10-CM | POA: Diagnosis not present

## 2019-11-13 DIAGNOSIS — R262 Difficulty in walking, not elsewhere classified: Secondary | ICD-10-CM | POA: Diagnosis not present

## 2019-11-13 DIAGNOSIS — M1990 Unspecified osteoarthritis, unspecified site: Secondary | ICD-10-CM | POA: Diagnosis not present

## 2019-11-14 DIAGNOSIS — R262 Difficulty in walking, not elsewhere classified: Secondary | ICD-10-CM | POA: Diagnosis not present

## 2019-11-14 DIAGNOSIS — I10 Essential (primary) hypertension: Secondary | ICD-10-CM | POA: Diagnosis not present

## 2019-11-14 DIAGNOSIS — F0150 Vascular dementia without behavioral disturbance: Secondary | ICD-10-CM | POA: Diagnosis not present

## 2019-11-14 DIAGNOSIS — M1990 Unspecified osteoarthritis, unspecified site: Secondary | ICD-10-CM | POA: Diagnosis not present

## 2019-11-15 DIAGNOSIS — F0150 Vascular dementia without behavioral disturbance: Secondary | ICD-10-CM | POA: Diagnosis not present

## 2019-11-15 DIAGNOSIS — R262 Difficulty in walking, not elsewhere classified: Secondary | ICD-10-CM | POA: Diagnosis not present

## 2019-11-15 DIAGNOSIS — I10 Essential (primary) hypertension: Secondary | ICD-10-CM | POA: Diagnosis not present

## 2019-11-15 DIAGNOSIS — M1990 Unspecified osteoarthritis, unspecified site: Secondary | ICD-10-CM | POA: Diagnosis not present

## 2019-11-18 DIAGNOSIS — M1990 Unspecified osteoarthritis, unspecified site: Secondary | ICD-10-CM | POA: Diagnosis not present

## 2019-11-18 DIAGNOSIS — R262 Difficulty in walking, not elsewhere classified: Secondary | ICD-10-CM | POA: Diagnosis not present

## 2019-11-18 DIAGNOSIS — F0150 Vascular dementia without behavioral disturbance: Secondary | ICD-10-CM | POA: Diagnosis not present

## 2019-11-18 DIAGNOSIS — I10 Essential (primary) hypertension: Secondary | ICD-10-CM | POA: Diagnosis not present

## 2019-11-19 DIAGNOSIS — I10 Essential (primary) hypertension: Secondary | ICD-10-CM | POA: Diagnosis not present

## 2019-11-19 DIAGNOSIS — R262 Difficulty in walking, not elsewhere classified: Secondary | ICD-10-CM | POA: Diagnosis not present

## 2019-11-19 DIAGNOSIS — F0150 Vascular dementia without behavioral disturbance: Secondary | ICD-10-CM | POA: Diagnosis not present

## 2019-11-19 DIAGNOSIS — M1990 Unspecified osteoarthritis, unspecified site: Secondary | ICD-10-CM | POA: Diagnosis not present

## 2019-11-20 DIAGNOSIS — G479 Sleep disorder, unspecified: Secondary | ICD-10-CM | POA: Diagnosis not present

## 2019-11-22 DIAGNOSIS — M1990 Unspecified osteoarthritis, unspecified site: Secondary | ICD-10-CM | POA: Diagnosis not present

## 2019-11-22 DIAGNOSIS — R262 Difficulty in walking, not elsewhere classified: Secondary | ICD-10-CM | POA: Diagnosis not present

## 2019-11-22 DIAGNOSIS — I10 Essential (primary) hypertension: Secondary | ICD-10-CM | POA: Diagnosis not present

## 2019-11-22 DIAGNOSIS — F0150 Vascular dementia without behavioral disturbance: Secondary | ICD-10-CM | POA: Diagnosis not present

## 2019-11-26 DIAGNOSIS — F0150 Vascular dementia without behavioral disturbance: Secondary | ICD-10-CM | POA: Diagnosis not present

## 2019-11-26 DIAGNOSIS — S0033XA Contusion of nose, initial encounter: Secondary | ICD-10-CM | POA: Diagnosis not present

## 2019-11-26 DIAGNOSIS — I10 Essential (primary) hypertension: Secondary | ICD-10-CM | POA: Diagnosis not present

## 2019-11-26 DIAGNOSIS — M1990 Unspecified osteoarthritis, unspecified site: Secondary | ICD-10-CM | POA: Diagnosis not present

## 2019-11-26 DIAGNOSIS — R262 Difficulty in walking, not elsewhere classified: Secondary | ICD-10-CM | POA: Diagnosis not present

## 2019-11-27 DIAGNOSIS — F0150 Vascular dementia without behavioral disturbance: Secondary | ICD-10-CM | POA: Diagnosis not present

## 2019-11-27 DIAGNOSIS — R262 Difficulty in walking, not elsewhere classified: Secondary | ICD-10-CM | POA: Diagnosis not present

## 2019-11-27 DIAGNOSIS — I10 Essential (primary) hypertension: Secondary | ICD-10-CM | POA: Diagnosis not present

## 2019-11-27 DIAGNOSIS — M1990 Unspecified osteoarthritis, unspecified site: Secondary | ICD-10-CM | POA: Diagnosis not present

## 2019-11-28 DIAGNOSIS — R262 Difficulty in walking, not elsewhere classified: Secondary | ICD-10-CM | POA: Diagnosis not present

## 2019-11-28 DIAGNOSIS — F0150 Vascular dementia without behavioral disturbance: Secondary | ICD-10-CM | POA: Diagnosis not present

## 2019-11-28 DIAGNOSIS — M1990 Unspecified osteoarthritis, unspecified site: Secondary | ICD-10-CM | POA: Diagnosis not present

## 2019-11-28 DIAGNOSIS — I10 Essential (primary) hypertension: Secondary | ICD-10-CM | POA: Diagnosis not present

## 2019-11-29 DIAGNOSIS — I10 Essential (primary) hypertension: Secondary | ICD-10-CM | POA: Diagnosis not present

## 2019-11-29 DIAGNOSIS — R262 Difficulty in walking, not elsewhere classified: Secondary | ICD-10-CM | POA: Diagnosis not present

## 2019-11-29 DIAGNOSIS — F0150 Vascular dementia without behavioral disturbance: Secondary | ICD-10-CM | POA: Diagnosis not present

## 2019-11-29 DIAGNOSIS — M1990 Unspecified osteoarthritis, unspecified site: Secondary | ICD-10-CM | POA: Diagnosis not present

## 2019-12-03 DIAGNOSIS — I10 Essential (primary) hypertension: Secondary | ICD-10-CM | POA: Diagnosis not present

## 2019-12-03 DIAGNOSIS — M1990 Unspecified osteoarthritis, unspecified site: Secondary | ICD-10-CM | POA: Diagnosis not present

## 2019-12-03 DIAGNOSIS — F0150 Vascular dementia without behavioral disturbance: Secondary | ICD-10-CM | POA: Diagnosis not present

## 2019-12-03 DIAGNOSIS — R262 Difficulty in walking, not elsewhere classified: Secondary | ICD-10-CM | POA: Diagnosis not present

## 2019-12-04 DIAGNOSIS — F0150 Vascular dementia without behavioral disturbance: Secondary | ICD-10-CM | POA: Diagnosis not present

## 2019-12-04 DIAGNOSIS — I10 Essential (primary) hypertension: Secondary | ICD-10-CM | POA: Diagnosis not present

## 2019-12-04 DIAGNOSIS — R262 Difficulty in walking, not elsewhere classified: Secondary | ICD-10-CM | POA: Diagnosis not present

## 2019-12-04 DIAGNOSIS — M1990 Unspecified osteoarthritis, unspecified site: Secondary | ICD-10-CM | POA: Diagnosis not present

## 2019-12-05 DIAGNOSIS — M1990 Unspecified osteoarthritis, unspecified site: Secondary | ICD-10-CM | POA: Diagnosis not present

## 2019-12-05 DIAGNOSIS — R262 Difficulty in walking, not elsewhere classified: Secondary | ICD-10-CM | POA: Diagnosis not present

## 2019-12-05 DIAGNOSIS — I10 Essential (primary) hypertension: Secondary | ICD-10-CM | POA: Diagnosis not present

## 2019-12-05 DIAGNOSIS — F0150 Vascular dementia without behavioral disturbance: Secondary | ICD-10-CM | POA: Diagnosis not present

## 2019-12-06 DIAGNOSIS — M1990 Unspecified osteoarthritis, unspecified site: Secondary | ICD-10-CM | POA: Diagnosis not present

## 2019-12-06 DIAGNOSIS — I10 Essential (primary) hypertension: Secondary | ICD-10-CM | POA: Diagnosis not present

## 2019-12-06 DIAGNOSIS — F0150 Vascular dementia without behavioral disturbance: Secondary | ICD-10-CM | POA: Diagnosis not present

## 2019-12-06 DIAGNOSIS — R262 Difficulty in walking, not elsewhere classified: Secondary | ICD-10-CM | POA: Diagnosis not present

## 2019-12-10 DIAGNOSIS — I10 Essential (primary) hypertension: Secondary | ICD-10-CM | POA: Diagnosis not present

## 2019-12-10 DIAGNOSIS — M1990 Unspecified osteoarthritis, unspecified site: Secondary | ICD-10-CM | POA: Diagnosis not present

## 2019-12-10 DIAGNOSIS — R262 Difficulty in walking, not elsewhere classified: Secondary | ICD-10-CM | POA: Diagnosis not present

## 2019-12-10 DIAGNOSIS — F0150 Vascular dementia without behavioral disturbance: Secondary | ICD-10-CM | POA: Diagnosis not present

## 2019-12-11 DIAGNOSIS — I10 Essential (primary) hypertension: Secondary | ICD-10-CM | POA: Diagnosis not present

## 2019-12-11 DIAGNOSIS — F0150 Vascular dementia without behavioral disturbance: Secondary | ICD-10-CM | POA: Diagnosis not present

## 2019-12-11 DIAGNOSIS — M1990 Unspecified osteoarthritis, unspecified site: Secondary | ICD-10-CM | POA: Diagnosis not present

## 2019-12-11 DIAGNOSIS — R262 Difficulty in walking, not elsewhere classified: Secondary | ICD-10-CM | POA: Diagnosis not present

## 2019-12-12 DIAGNOSIS — I10 Essential (primary) hypertension: Secondary | ICD-10-CM | POA: Diagnosis not present

## 2019-12-12 DIAGNOSIS — F0150 Vascular dementia without behavioral disturbance: Secondary | ICD-10-CM | POA: Diagnosis not present

## 2019-12-12 DIAGNOSIS — M1990 Unspecified osteoarthritis, unspecified site: Secondary | ICD-10-CM | POA: Diagnosis not present

## 2019-12-12 DIAGNOSIS — R262 Difficulty in walking, not elsewhere classified: Secondary | ICD-10-CM | POA: Diagnosis not present

## 2019-12-13 DIAGNOSIS — I10 Essential (primary) hypertension: Secondary | ICD-10-CM | POA: Diagnosis not present

## 2019-12-13 DIAGNOSIS — M1990 Unspecified osteoarthritis, unspecified site: Secondary | ICD-10-CM | POA: Diagnosis not present

## 2019-12-13 DIAGNOSIS — F0150 Vascular dementia without behavioral disturbance: Secondary | ICD-10-CM | POA: Diagnosis not present

## 2019-12-13 DIAGNOSIS — R262 Difficulty in walking, not elsewhere classified: Secondary | ICD-10-CM | POA: Diagnosis not present

## 2019-12-14 DIAGNOSIS — I10 Essential (primary) hypertension: Secondary | ICD-10-CM | POA: Diagnosis not present

## 2019-12-14 DIAGNOSIS — R262 Difficulty in walking, not elsewhere classified: Secondary | ICD-10-CM | POA: Diagnosis not present

## 2019-12-14 DIAGNOSIS — M1990 Unspecified osteoarthritis, unspecified site: Secondary | ICD-10-CM | POA: Diagnosis not present

## 2019-12-14 DIAGNOSIS — F0150 Vascular dementia without behavioral disturbance: Secondary | ICD-10-CM | POA: Diagnosis not present

## 2019-12-17 DIAGNOSIS — F0150 Vascular dementia without behavioral disturbance: Secondary | ICD-10-CM | POA: Diagnosis not present

## 2019-12-17 DIAGNOSIS — R262 Difficulty in walking, not elsewhere classified: Secondary | ICD-10-CM | POA: Diagnosis not present

## 2019-12-17 DIAGNOSIS — M1990 Unspecified osteoarthritis, unspecified site: Secondary | ICD-10-CM | POA: Diagnosis not present

## 2019-12-17 DIAGNOSIS — I10 Essential (primary) hypertension: Secondary | ICD-10-CM | POA: Diagnosis not present

## 2019-12-19 DIAGNOSIS — F0150 Vascular dementia without behavioral disturbance: Secondary | ICD-10-CM | POA: Diagnosis not present

## 2019-12-19 DIAGNOSIS — I10 Essential (primary) hypertension: Secondary | ICD-10-CM | POA: Diagnosis not present

## 2019-12-19 DIAGNOSIS — R262 Difficulty in walking, not elsewhere classified: Secondary | ICD-10-CM | POA: Diagnosis not present

## 2019-12-19 DIAGNOSIS — M1990 Unspecified osteoarthritis, unspecified site: Secondary | ICD-10-CM | POA: Diagnosis not present

## 2019-12-20 DIAGNOSIS — Y92129 Unspecified place in nursing home as the place of occurrence of the external cause: Secondary | ICD-10-CM | POA: Diagnosis not present

## 2019-12-20 DIAGNOSIS — W19XXXA Unspecified fall, initial encounter: Secondary | ICD-10-CM | POA: Diagnosis not present

## 2019-12-20 DIAGNOSIS — M25551 Pain in right hip: Secondary | ICD-10-CM | POA: Diagnosis not present

## 2019-12-20 DIAGNOSIS — M79604 Pain in right leg: Secondary | ICD-10-CM | POA: Diagnosis not present

## 2019-12-20 DIAGNOSIS — M79661 Pain in right lower leg: Secondary | ICD-10-CM | POA: Diagnosis not present

## 2019-12-24 DIAGNOSIS — I10 Essential (primary) hypertension: Secondary | ICD-10-CM | POA: Diagnosis not present

## 2019-12-24 DIAGNOSIS — R262 Difficulty in walking, not elsewhere classified: Secondary | ICD-10-CM | POA: Diagnosis not present

## 2019-12-24 DIAGNOSIS — F0150 Vascular dementia without behavioral disturbance: Secondary | ICD-10-CM | POA: Diagnosis not present

## 2019-12-24 DIAGNOSIS — M1990 Unspecified osteoarthritis, unspecified site: Secondary | ICD-10-CM | POA: Diagnosis not present

## 2019-12-25 DIAGNOSIS — I10 Essential (primary) hypertension: Secondary | ICD-10-CM | POA: Diagnosis not present

## 2019-12-25 DIAGNOSIS — M1990 Unspecified osteoarthritis, unspecified site: Secondary | ICD-10-CM | POA: Diagnosis not present

## 2019-12-25 DIAGNOSIS — F0150 Vascular dementia without behavioral disturbance: Secondary | ICD-10-CM | POA: Diagnosis not present

## 2019-12-25 DIAGNOSIS — R262 Difficulty in walking, not elsewhere classified: Secondary | ICD-10-CM | POA: Diagnosis not present

## 2019-12-26 DIAGNOSIS — F0150 Vascular dementia without behavioral disturbance: Secondary | ICD-10-CM | POA: Diagnosis not present

## 2019-12-26 DIAGNOSIS — M1990 Unspecified osteoarthritis, unspecified site: Secondary | ICD-10-CM | POA: Diagnosis not present

## 2019-12-26 DIAGNOSIS — I10 Essential (primary) hypertension: Secondary | ICD-10-CM | POA: Diagnosis not present

## 2019-12-26 DIAGNOSIS — R262 Difficulty in walking, not elsewhere classified: Secondary | ICD-10-CM | POA: Diagnosis not present

## 2020-01-03 DIAGNOSIS — F0150 Vascular dementia without behavioral disturbance: Secondary | ICD-10-CM

## 2020-01-03 DIAGNOSIS — M79662 Pain in left lower leg: Secondary | ICD-10-CM | POA: Diagnosis not present

## 2020-01-03 DIAGNOSIS — N183 Chronic kidney disease, stage 3 unspecified: Secondary | ICD-10-CM

## 2020-01-03 DIAGNOSIS — E039 Hypothyroidism, unspecified: Secondary | ICD-10-CM

## 2020-01-03 DIAGNOSIS — H540X33 Blindness right eye category 3, blindness left eye category 3: Secondary | ICD-10-CM

## 2020-01-03 DIAGNOSIS — F39 Unspecified mood [affective] disorder: Secondary | ICD-10-CM

## 2020-01-03 DIAGNOSIS — I10 Essential (primary) hypertension: Secondary | ICD-10-CM

## 2020-01-24 DIAGNOSIS — F0150 Vascular dementia without behavioral disturbance: Secondary | ICD-10-CM | POA: Diagnosis not present

## 2020-01-24 DIAGNOSIS — R262 Difficulty in walking, not elsewhere classified: Secondary | ICD-10-CM | POA: Diagnosis not present

## 2020-01-24 DIAGNOSIS — I10 Essential (primary) hypertension: Secondary | ICD-10-CM | POA: Diagnosis not present

## 2020-01-24 DIAGNOSIS — M1990 Unspecified osteoarthritis, unspecified site: Secondary | ICD-10-CM | POA: Diagnosis not present

## 2020-03-12 DIAGNOSIS — H547 Unspecified visual loss: Secondary | ICD-10-CM | POA: Diagnosis not present

## 2020-03-12 DIAGNOSIS — F39 Unspecified mood [affective] disorder: Secondary | ICD-10-CM | POA: Diagnosis not present

## 2020-03-12 DIAGNOSIS — I10 Essential (primary) hypertension: Secondary | ICD-10-CM | POA: Diagnosis not present

## 2020-03-12 DIAGNOSIS — F0150 Vascular dementia without behavioral disturbance: Secondary | ICD-10-CM | POA: Diagnosis not present

## 2020-05-06 DIAGNOSIS — E039 Hypothyroidism, unspecified: Secondary | ICD-10-CM | POA: Diagnosis not present

## 2020-05-06 DIAGNOSIS — I10 Essential (primary) hypertension: Secondary | ICD-10-CM | POA: Diagnosis not present

## 2020-05-06 DIAGNOSIS — F0150 Vascular dementia without behavioral disturbance: Secondary | ICD-10-CM | POA: Diagnosis not present

## 2020-05-06 DIAGNOSIS — N183 Chronic kidney disease, stage 3 unspecified: Secondary | ICD-10-CM | POA: Diagnosis not present

## 2020-05-06 DIAGNOSIS — F39 Unspecified mood [affective] disorder: Secondary | ICD-10-CM | POA: Diagnosis not present

## 2020-05-06 DIAGNOSIS — H547 Unspecified visual loss: Secondary | ICD-10-CM | POA: Diagnosis not present

## 2020-06-11 DIAGNOSIS — K0501 Acute gingivitis, non-plaque induced: Secondary | ICD-10-CM | POA: Diagnosis not present

## 2020-06-25 DIAGNOSIS — K0500 Acute gingivitis, plaque induced: Secondary | ICD-10-CM | POA: Diagnosis not present

## 2020-07-07 DIAGNOSIS — R410 Disorientation, unspecified: Secondary | ICD-10-CM | POA: Diagnosis not present

## 2020-07-07 DIAGNOSIS — G0490 Encephalitis and encephalomyelitis, unspecified: Secondary | ICD-10-CM | POA: Diagnosis not present

## 2020-07-07 DIAGNOSIS — G9340 Encephalopathy, unspecified: Secondary | ICD-10-CM | POA: Diagnosis not present

## 2020-07-08 DIAGNOSIS — R410 Disorientation, unspecified: Secondary | ICD-10-CM | POA: Diagnosis not present

## 2020-07-08 DIAGNOSIS — G9340 Encephalopathy, unspecified: Secondary | ICD-10-CM | POA: Diagnosis not present

## 2020-07-09 DIAGNOSIS — F39 Unspecified mood [affective] disorder: Secondary | ICD-10-CM | POA: Diagnosis not present

## 2020-07-09 DIAGNOSIS — I10 Essential (primary) hypertension: Secondary | ICD-10-CM | POA: Diagnosis not present

## 2020-07-09 DIAGNOSIS — M159 Polyosteoarthritis, unspecified: Secondary | ICD-10-CM | POA: Diagnosis not present

## 2020-07-09 DIAGNOSIS — I6930 Unspecified sequelae of cerebral infarction: Secondary | ICD-10-CM | POA: Diagnosis not present

## 2020-07-28 DIAGNOSIS — R634 Abnormal weight loss: Secondary | ICD-10-CM | POA: Diagnosis not present

## 2020-07-28 DIAGNOSIS — M1990 Unspecified osteoarthritis, unspecified site: Secondary | ICD-10-CM | POA: Diagnosis not present

## 2020-07-28 DIAGNOSIS — I129 Hypertensive chronic kidney disease with stage 1 through stage 4 chronic kidney disease, or unspecified chronic kidney disease: Secondary | ICD-10-CM | POA: Diagnosis not present

## 2020-07-28 DIAGNOSIS — Z6821 Body mass index (BMI) 21.0-21.9, adult: Secondary | ICD-10-CM | POA: Diagnosis not present

## 2020-07-28 DIAGNOSIS — E785 Hyperlipidemia, unspecified: Secondary | ICD-10-CM | POA: Diagnosis not present

## 2020-07-28 DIAGNOSIS — M24542 Contracture, left hand: Secondary | ICD-10-CM | POA: Diagnosis not present

## 2020-07-28 DIAGNOSIS — I69351 Hemiplegia and hemiparesis following cerebral infarction affecting right dominant side: Secondary | ICD-10-CM | POA: Diagnosis not present

## 2020-07-28 DIAGNOSIS — F419 Anxiety disorder, unspecified: Secondary | ICD-10-CM | POA: Diagnosis not present

## 2020-07-28 DIAGNOSIS — H04129 Dry eye syndrome of unspecified lacrimal gland: Secondary | ICD-10-CM | POA: Diagnosis not present

## 2020-07-28 DIAGNOSIS — E039 Hypothyroidism, unspecified: Secondary | ICD-10-CM | POA: Diagnosis not present

## 2020-07-28 DIAGNOSIS — N1832 Chronic kidney disease, stage 3b: Secondary | ICD-10-CM | POA: Diagnosis not present

## 2020-07-28 DIAGNOSIS — F0150 Vascular dementia without behavioral disturbance: Secondary | ICD-10-CM | POA: Diagnosis not present

## 2020-07-28 DIAGNOSIS — H548 Legal blindness, as defined in USA: Secondary | ICD-10-CM | POA: Diagnosis not present

## 2020-07-28 DIAGNOSIS — H409 Unspecified glaucoma: Secondary | ICD-10-CM | POA: Diagnosis not present

## 2020-07-28 DIAGNOSIS — R627 Adult failure to thrive: Secondary | ICD-10-CM | POA: Diagnosis not present

## 2020-07-28 DIAGNOSIS — F32A Depression, unspecified: Secondary | ICD-10-CM | POA: Diagnosis not present

## 2020-07-29 DIAGNOSIS — I129 Hypertensive chronic kidney disease with stage 1 through stage 4 chronic kidney disease, or unspecified chronic kidney disease: Secondary | ICD-10-CM | POA: Diagnosis not present

## 2020-07-29 DIAGNOSIS — I69351 Hemiplegia and hemiparesis following cerebral infarction affecting right dominant side: Secondary | ICD-10-CM | POA: Diagnosis not present

## 2020-07-29 DIAGNOSIS — F419 Anxiety disorder, unspecified: Secondary | ICD-10-CM | POA: Diagnosis not present

## 2020-07-29 DIAGNOSIS — F0150 Vascular dementia without behavioral disturbance: Secondary | ICD-10-CM | POA: Diagnosis not present

## 2020-07-29 DIAGNOSIS — F32A Depression, unspecified: Secondary | ICD-10-CM | POA: Diagnosis not present

## 2020-07-29 DIAGNOSIS — N1832 Chronic kidney disease, stage 3b: Secondary | ICD-10-CM | POA: Diagnosis not present

## 2020-07-30 DIAGNOSIS — F419 Anxiety disorder, unspecified: Secondary | ICD-10-CM | POA: Diagnosis not present

## 2020-07-30 DIAGNOSIS — F32A Depression, unspecified: Secondary | ICD-10-CM | POA: Diagnosis not present

## 2020-07-30 DIAGNOSIS — I129 Hypertensive chronic kidney disease with stage 1 through stage 4 chronic kidney disease, or unspecified chronic kidney disease: Secondary | ICD-10-CM | POA: Diagnosis not present

## 2020-07-30 DIAGNOSIS — I69351 Hemiplegia and hemiparesis following cerebral infarction affecting right dominant side: Secondary | ICD-10-CM | POA: Diagnosis not present

## 2020-07-30 DIAGNOSIS — F0150 Vascular dementia without behavioral disturbance: Secondary | ICD-10-CM | POA: Diagnosis not present

## 2020-07-30 DIAGNOSIS — N1832 Chronic kidney disease, stage 3b: Secondary | ICD-10-CM | POA: Diagnosis not present

## 2020-08-03 DIAGNOSIS — I129 Hypertensive chronic kidney disease with stage 1 through stage 4 chronic kidney disease, or unspecified chronic kidney disease: Secondary | ICD-10-CM | POA: Diagnosis not present

## 2020-08-03 DIAGNOSIS — F419 Anxiety disorder, unspecified: Secondary | ICD-10-CM | POA: Diagnosis not present

## 2020-08-03 DIAGNOSIS — I69351 Hemiplegia and hemiparesis following cerebral infarction affecting right dominant side: Secondary | ICD-10-CM | POA: Diagnosis not present

## 2020-08-03 DIAGNOSIS — N1832 Chronic kidney disease, stage 3b: Secondary | ICD-10-CM | POA: Diagnosis not present

## 2020-08-03 DIAGNOSIS — F0150 Vascular dementia without behavioral disturbance: Secondary | ICD-10-CM | POA: Diagnosis not present

## 2020-08-03 DIAGNOSIS — F32A Depression, unspecified: Secondary | ICD-10-CM | POA: Diagnosis not present

## 2020-08-17 DEATH — deceased
